# Patient Record
Sex: Male | Born: 1945 | Race: White | Hispanic: No | Marital: Married | Smoking: Current every day smoker
Health system: Southern US, Community
[De-identification: ages and names within clinical notes are randomized; demographics above are authoritative.]

## PROBLEM LIST (undated history)

## (undated) DIAGNOSIS — J439 Emphysema, unspecified: Secondary | ICD-10-CM

## (undated) DIAGNOSIS — I10 Essential (primary) hypertension: Secondary | ICD-10-CM

## (undated) DIAGNOSIS — I2510 Atherosclerotic heart disease of native coronary artery without angina pectoris: Secondary | ICD-10-CM

## (undated) DIAGNOSIS — I255 Ischemic cardiomyopathy: Secondary | ICD-10-CM

## (undated) DIAGNOSIS — J449 Chronic obstructive pulmonary disease, unspecified: Secondary | ICD-10-CM

## (undated) DIAGNOSIS — I739 Peripheral vascular disease, unspecified: Secondary | ICD-10-CM

## (undated) DIAGNOSIS — E785 Hyperlipidemia, unspecified: Secondary | ICD-10-CM

## (undated) HISTORY — DX: Atherosclerotic heart disease of native coronary artery without angina pectoris: I25.10

## (undated) HISTORY — DX: Essential (primary) hypertension: I10

## (undated) HISTORY — PX: SPINE SURGERY: SHX786

## (undated) HISTORY — DX: Ischemic cardiomyopathy: I25.5

## (undated) HISTORY — PX: OTHER SURGICAL HISTORY: SHX169

## (undated) HISTORY — DX: Chronic obstructive pulmonary disease, unspecified: J44.9

## (undated) HISTORY — PX: BACK SURGERY: SHX140

## (undated) HISTORY — DX: Emphysema, unspecified: J43.9

## (undated) HISTORY — DX: Peripheral vascular disease, unspecified: I73.9

## (undated) HISTORY — DX: Hyperlipidemia, unspecified: E78.5

---

## 2009-05-02 ENCOUNTER — Encounter: Payer: Self-pay | Admitting: Cardiovascular Disease

## 2009-05-14 ENCOUNTER — Encounter: Payer: Self-pay | Admitting: Cardiovascular Disease

## 2009-05-22 ENCOUNTER — Ambulatory Visit: Payer: Self-pay | Admitting: Cardiovascular Disease

## 2009-05-22 ENCOUNTER — Encounter: Payer: Self-pay | Admitting: Cardiovascular Disease

## 2009-05-27 ENCOUNTER — Encounter: Payer: Self-pay | Admitting: Cardiovascular Disease

## 2009-06-06 ENCOUNTER — Encounter: Payer: Self-pay | Admitting: Cardiovascular Disease

## 2009-06-07 ENCOUNTER — Telehealth: Payer: Self-pay | Admitting: Cardiovascular Disease

## 2009-07-30 ENCOUNTER — Ambulatory Visit: Payer: Self-pay | Admitting: Cardiovascular Disease

## 2009-07-31 ENCOUNTER — Telehealth (INDEPENDENT_AMBULATORY_CARE_PROVIDER_SITE_OTHER): Payer: Self-pay | Admitting: *Deleted

## 2009-08-01 ENCOUNTER — Encounter: Payer: Self-pay | Admitting: Cardiology

## 2009-08-01 ENCOUNTER — Encounter (HOSPITAL_COMMUNITY): Admission: RE | Admit: 2009-08-01 | Discharge: 2009-10-03 | Payer: Self-pay | Admitting: Cardiovascular Disease

## 2009-08-01 ENCOUNTER — Ambulatory Visit: Payer: Self-pay

## 2009-08-01 ENCOUNTER — Ambulatory Visit: Payer: Self-pay | Admitting: Cardiology

## 2009-08-02 ENCOUNTER — Telehealth: Payer: Self-pay | Admitting: Cardiovascular Disease

## 2009-08-13 ENCOUNTER — Encounter: Payer: Self-pay | Admitting: Cardiovascular Disease

## 2009-08-13 DIAGNOSIS — I739 Peripheral vascular disease, unspecified: Secondary | ICD-10-CM | POA: Insufficient documentation

## 2009-08-23 ENCOUNTER — Encounter: Payer: Self-pay | Admitting: Cardiovascular Disease

## 2009-08-23 ENCOUNTER — Ambulatory Visit: Payer: Self-pay

## 2009-08-27 ENCOUNTER — Ambulatory Visit: Payer: Self-pay | Admitting: Cardiovascular Disease

## 2009-08-27 ENCOUNTER — Encounter (INDEPENDENT_AMBULATORY_CARE_PROVIDER_SITE_OTHER): Payer: Self-pay | Admitting: *Deleted

## 2009-08-29 ENCOUNTER — Encounter: Payer: Self-pay | Admitting: Cardiovascular Disease

## 2009-09-04 ENCOUNTER — Ambulatory Visit: Payer: Self-pay | Admitting: Cardiovascular Disease

## 2009-09-04 ENCOUNTER — Ambulatory Visit (HOSPITAL_COMMUNITY): Admission: RE | Admit: 2009-09-04 | Discharge: 2009-09-04 | Payer: Self-pay | Admitting: Cardiovascular Disease

## 2010-02-11 NOTE — Miscellaneous (Signed)
  Clinical Lists Changes  Medications: Added new medication of PLAVIX 75 MG TABS (CLOPIDOGREL BISULFATE) Take one tablet by mouth daily - Signed Rx of PLAVIX 75 MG TABS (CLOPIDOGREL BISULFATE) Take one tablet by mouth daily;  #30 x 12;  Signed;  Entered by: Deliah Goody, RN;  Authorized by: Lorine Bears MD;  Method used: Electronically to [facility]. [phone]*, [address], [place], [place], [place]  [address], Ph: [phone] or [phone], Fax: [phone]    Prescriptions: PLAVIX 75 MG TABS (CLOPIDOGREL BISULFATE) Take one tablet by mouth daily  #30 x 12   Entered by:   Deliah Goody, RN   Authorized by:   Lorine Bears MD   Signed by:   Deliah Goody, RN on 08/27/2009   Method used:   Electronically to        [facility]. [phone]* (retail)       [address]       [place], [place]  [address]       Ph: [phone] or [phone]       Fax: [phone]   RxID:   [id number]

## 2010-02-11 NOTE — Assessment & Plan Note (Signed)
Summary: Cardiology Nuclear Study  Nuclear Med Background Indications for Stress Test: Evaluation for Ischemia, Graft Patency, Stent Patency   History: CABG, Echo, Heart Catheterization, Stents  History Comments: h/o MI x 3; multiple Stents; '99 CABG x 4;  3/11 Echo:EF=15-20%; h/o CHF/ICM  Symptoms: DOE, Fatigue, SOB    Nuclear Pre-Procedure Cardiac Risk Factors: Claudication, Hypertension, LBBB, Lipids, PVD, Smoker Caffeine/Decaff Intake: None NPO After: 8:00 PM Lungs: Diminished with exp. wheezes  Albuterol Inhaler 2 puffs given IV 0.9% NS with Angio Cath: 22g     IV Site: (R) AC IV Started by: Irean Hong RN Chest Size (in) 38     Height (in): 65.5 Weight (lb): 131 BMI: 21.55  Nuclear Med Study 1 or 2 day study:  1 day     Stress Test Type:  Eugenie Birks Reading MD:  Marca Ancona, MD     Referring MD:  Judie Petit.Arida Resting Radionuclide:  Technetium 74m Tetrofosmin     Resting Radionuclide Dose:  11.0 mCi  Stress Radionuclide:  Technetium 71m Tetrofosmin     Stress Radionuclide Dose:  33.0 mCi   Stress Protocol   Lexiscan: 0.4 mg   Stress Test Technologist:  Milana Na EMT-P     Nuclear Technologist:  Harlow Asa CNMT  Rest Procedure  Myocardial perfusion imaging was performed at rest 45 minutes following the intravenous administration of Myoview Technetium 57m Tetrofosmin.  Stress Procedure  The patient received IV Lexiscan 0.4 mg over 15-seconds.  Myoview injected at 30-seconds.  There were no significant changes with infusion.  Quantitative spect images were obtained after a 45 minute delay.  QPS Raw Data Images:  Normal; no motion artifact; normal heart/lung ratio. Stress Images:  Basal to mid anteroseptal, apical, and basal inferolateral perfusion defects.  Rest Images:  Basal to mid anteroseptal perfusion defect not as marked as with stress.  Apical and basal inferolateral perfusion defect similar to stress.  Subtraction (SDS):  Partially reversible basal  to mid anteroseptal perfusion defect.  Fixed apical perfusion defect and fixed basal inferolateral perfusion defect.  Transient Ischemic Dilatation:  1.05  (Normal <1.22)  Lung/Heart Ratio:  .35  (Normal <0.45)  Quantitative Gated Spect Images QGS EDV:  144 ml QGS ESV:  104 ml QGS EF:  28 % QGS cine images:  Severe global hypokinesis.  The LV appears mildly dilated.    Overall Impression  Exercise Capacity: Lexiscan study  BP Response: Normal blood pressure response. Clinical Symptoms: Short of breath ECG Impression: Baseline IVCD with some ST-T abnormalities, no change with infusion.  Overall Impression: Fixed apical and basal inferolateral perfusion defects likely suggestive of infarction.  Partially reversible basal to mid anteroseptal perfusion defect suggests component of ischemia. Overall Impression Comments: Severe systolic dysfunction.

## 2010-02-11 NOTE — Miscellaneous (Signed)
Summary: Orders Update  Clinical Lists Changes  Orders: Added new Referral order of PV Procedure (PV Procedure) - Signed 

## 2010-02-11 NOTE — Progress Notes (Signed)
  Phone Note Outgoing Call   Call placed by: Dessie Coma,  Jun 07, 2009 1:42 PM Summary of Call: Patient notified per Dr. Freida Busman, labs look good.

## 2010-02-11 NOTE — Progress Notes (Signed)
  Phone Note Outgoing Call   Call placed by: Dessie Coma  LPN,  August 02, 2009 2:56 PM Call placed to: Patient Summary of Call: Patient notified per Dr. Kirke Corin, Nuclear Stress test was OK.  To keep f/u appt on 08/16/09.

## 2010-02-11 NOTE — Letter (Signed)
Summary: Peripheral Vascular  [facility], Main Office  [address]   [place], [place] [address]   Phone: [phone]  Fax: [phone]     08/29/2009 MRN: [id number]  AUTHOR HATLESTAD [address] [place], [place]  [address]  Dear Evan Weeks,   You are scheduled for Peripheral Vascular Angiogram on Wednesday September 04, 2009 with Dr. Excell Seltzer.  Please arrive at the [facility] at 7:00       a.m. on the day of your procedure.  1. DIET     _X___ Nothing to eat or drink after midnight except your medications with a sip of water.  2. MAKE SURE YOU TAKE YOUR ASPIRIN AND PLAVIX.  3. __X___ DO NOT TAKE these medications before your procedure:        Do not take Furosemide the morning of procedure.        _X___ YOU MAY TAKE ALL of your remaining medications with a small amount of water.     4. Plan for one night stay - bring personal belongings (i.e. toothpaste, toothbrush, etc.)  5. Bring a current list of your medications and current insurance cards.  6. Must have a responsible person to drive you home.   7. Someone must be with you for the first 24 hours after you arrive home.  8. Please wear clothes that are easy to get on and off and wear slip-on shoes.  *Special note: Every effort is made to have your procedure done on time.  Occasionally there are emergencies that present themselves at the hospital that may cause delays.  Please be patient if a delay does occur.  If you have any questions after you get home, please call the office at the number listed above.   Julieta Gutting, RN, BSN

## 2010-02-11 NOTE — Miscellaneous (Signed)
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 

## 2010-02-11 NOTE — Progress Notes (Signed)
Summary: NUCLEAR PRE PROCEDURE  Phone Note Outgoing Call Call back at [facility] Phone [phone]   Call placed by: Rea College, CMA,  July 31, 2009 3:39 PM Call placed to: Patient Summary of Call: Reviewed information on Myoview Information Sheet (see scanned document for further details).  Spoke with patient.      Nuclear Med Background Indications for Stress Test: Evaluation for Ischemia, Graft Patency, Stent Patency   History: CABG, Echo, Heart Catheterization, Stents  History Comments: h/o MI x 3; multiple Stents; '99 CABG x 4;  3/11 Echo:EF=15-20%; h/o CHF/ICM  Symptoms: DOE, SOB    Nuclear Pre-Procedure Cardiac Risk Factors: Claudication, Hypertension, LBBB, Lipids, PVD, Smoker

## 2010-03-28 LAB — CBC
HCT: 41.7 % (ref 39.0–52.0)
MCHC: 34.5 g/dL (ref 30.0–36.0)
RDW: 12.9 % (ref 11.5–15.5)

## 2010-03-28 LAB — BASIC METABOLIC PANEL
BUN: 25 mg/dL — ABNORMAL HIGH (ref 6–23)
CO2: 29 mEq/L (ref 19–32)
Calcium: 9.7 mg/dL (ref 8.4–10.5)
GFR calc non Af Amer: >60 mL/min (ref >60)
Potassium: 4.8 mEq/L (ref 3.5–5.1)
Sodium: 135 mEq/L (ref 135–145)

## 2010-05-22 ENCOUNTER — Encounter: Payer: Self-pay | Admitting: Cardiovascular Disease

## 2010-05-27 NOTE — Letter (Signed)
May 22, 2009    Charlott Rakes, MD  [address]  [address]  [place], [place] [address]   RE:  AUGUSTO, DECKMAN  MRN:  [id number]  /  DOB:  1945-07-17   Dear Dr. Yetta Flock,   I had a pleasure of seeing Mr. Tenpas in clinic this morning.  As you  know he is a 65 year old white male with a history of ischemic  cardiomyopathy with an ejection fraction of 25-30%.  He has recently had  an increase in dyspnea that has been responsive to outpatient treatment  with Lasix and lisinopril.  He currently denies any symptoms of angina  and has symptoms consistent with NYHA class II heart failure.  He had  been on metoprolol, however, he is no longer taking this medication.  Today in clinic, I have started Coreg 3.125 mg b.i.d.  I have asked him  to continue on the aspirin, lisinopril, and Lasix.  I have set him up  with an appointment to see Dr. Graciela Husbands regarding evaluation for possible  biventricular pacing and ICD placement.   Thank you for the referral of this patient.  I look forward to following  him along with you.    Sincerely,      Brayton El, MD  Electronically Signed    SGA/MedQ  DD: 05/22/2009  DT: 05/23/2009  Job #: [id number]

## 2010-05-27 NOTE — Assessment & Plan Note (Signed)
[facility]                        [facility] OFFICE NOTE   NAME:Evan Weeks, Evan Weeks                        MRN:          [id number]  DATE:07/30/2009                            DOB:          [birth year]    PRIMARY CARE PHYSICIAN:  Dr. Yetta Flock.   CHIEF COMPLAINT:  Shortness of breath and lower extremity claudication.   PROBLEMS LIST:  1. Coronary artery disease status post myocardial infarction x3.  He      underwent multiple PCIs in the past before he ultimately underwent      coronary artery bypass surgery in 1999 at [facility].  No interventions since then.  2. Ischemic cardiomyopathy with an ejection fraction of 20-25%.  3. Peripheral arterial disease status post bilateral iliac      intervention in May and June 19, 2008.  4. Hypertension.  5. Hyperlipidemia.  6. Tobacco use.   INTERVAL HISTORY:  Evan Weeks is here today for a followup visit.  He  was seen by Dr. Freida Busman in May for evaluation of severely reduced LV  systolic function.  At that time, he was started on small-dose  carvedilol.  The patient has been taking all his medications.  His  dyspnea has improved significantly after he was started on Lasix.  He  does have chronic dyspnea; however, he is not able to do much physical  activities due to limitations related to bilateral lower extremity  claudication.  He starts getting discomfort in the lower thigh and  bilateral calf areas with minimal walking.  This actually has been  getting worse.  He did have improvement after he had angioplasty done  last year, but most recently has been having these symptoms even with  walking less than 100 feet.  Usually, he continues to walk with the  pain.  He denies any chest pain.   SOCIAL HISTORY:  He continued to smoke 1 pack per day since he was 65  years old.  He drinks beer occasionally.   ALLERGIES:  No known drug allergies.   CURRENT MEDICATIONS:  1. Aspirin 325 mg  once daily.  2. Lisinopril 10 mg daily, this will be increased today to 20 mg      daily.  3. Lasix 20 mg daily.  4. Coreg 3.125 mg twice daily, this will be increased to 6.25 mg twice      daily.  5. Fish oil once daily.  6. A cholesterol medication that was started recently, but he is not      sure which one.   REVIEW OF SYSTEMS:  As above in HPI.  Otherwise, all systems have been  reviewed and are negative.   PHYSICAL EXAMINATION:  GENERAL:  He is in no acute distress.  VITAL SIGNS:  Weight is 135.6 pounds, blood pressure is 165/89, pulse is  84, oxygen saturation is 98% on room air.  NECK:  Reveals no masses, no JVD, or carotid bruits.  RESPIRATORY:  Normal.  Respiratory effort with no use of accessory  muscles.  There is decreased breath sounds at the base  especially.  There is no wheezing or crackles.  CARDIOVASCULAR:  Regular rate and rhythm with normal S1, S2.  There are  no gallops or murmurs.  ABDOMEN:  Benign, nontender, nondistended with no masses.  EXTREMITIES:  With no clubbing, cyanosis, or edema.  VASCULAR:  His femoral pulses are +1 or +2 bilaterally with bruits.  Popliteal pulses are not palpated.  The right dorsalis pedis pulse is +1  on the right side and absent on the left side.   IMPRESSION:  1. Congestive heart failure due to ischemic cardiomyopathy and      severely reduced LV systolic function.  He is currently [place]      Heart Association class II.  However, it is likely difficult to      exactly estimate his functional capacity due to significant lower      extremity claudication.  Most recent ejection fraction was 15-20%      by echocardiogram with mild mitral regurgitation.  This is not new      for him.  He did have a stress test in the past more than a year      ago and his ejection fraction was 20% at that time.  I think it is      important to evaluate for any residual ischemia and see if coronary      angiography is indicated.  Thus, I  recommend proceeding with      Lexiscan nuclear stress test.  In the meanwhile, we have to treat      him aggressively with heart failure medications.  I will go ahead      and increase his lisinopril to 20 mg once daily and increase Coreg      to 6.25 mg twice daily.  We will continue with small-dose Lasix.  I      discussed the indications of an ICD with him.  He will be referred      again to see Dr. Graciela Husbands after the current workup is done.  His QRS      was borderline, prolonged, but not sure if he would qualify for      biventricular ICD.  2. Coronary artery disease status post coronary artery bypass surgery.      Continue with aspirin daily.  He was also started on a cholesterol      medication, but does not know the name.  I asked him to bring his      medications with him for the next visit.  3. Peripheral arterial disease with significant lifestyle limiting      bilateral claudications, mostly below the knees.  He did have      previous iliac interventions last year.  I will go ahead and obtain      an ABI for further evaluation.  Based on the results, can consider      proceeding with angiography versus trial of cilostazol.  The      patient will return after these tests are done for further      evaluation.     Lorine Bears, MD  Electronically Signed    MA/MedQ  DD: 07/30/2009  DT: 07/31/2009  Job #: [id number]

## 2010-05-27 NOTE — Assessment & Plan Note (Signed)
[facility]                        [place] CARDIOLOG[facility] OFFICE NOTE   NAME:Evan Weeks, Evan Weeks                        MRN:          [id number]  DATE:08/27/2009                            DOB:          22-Oct-1945    This is a followup note.   PROBLEMS LIST:  1. Coronary artery disease status post myocardial infarction x3.  He      underwent multiple percutaneous coronary interventions in the past      before he ultimately underwent coronary artery bypass surgery in      1999 at [facility].  No interventions since then.  2. Ischemic cardiomyopathy with an ejection fraction of 20-25%.  3. Peripheral arterial disease status post right external iliac      angioplasty and stent placement in May 2010, followed by left      common iliac angioplasty in June 2010, done at [facility].  4. Hypertension.  5. Hyperlipidemia.  6. Tobacco use.   INTERVAL HISTORY:  Mr. Sales is here today for a followup visit.  During his last visit, I increased both his lisinopril and Coreg.  He  has been on small-dose furosemide.  His heart failure symptoms has  improved significantly.  At the moment, he denies any chest pain,  dyspnea, orthopnea, or PND.  His main complaint is continued lower  extremity claudication on the left side.  He describes discomfort and  cramps in the left calf as well as the thigh after walking less than 100  feet.  This is interfering with his activities of daily living.  Usually  the discomfort lasts for 5-10 minutes and it forces him to stop before  he can resume again.  There is no pain at rest.  There is no lower  extremity ulcers.   MEDICATIONS:  1. Aspirin 325 mg daily.  2. Furosemide 20 mg once daily.  3. Garlic.  4. Fish oil.  5. Lisinopril 20 mg once daily.  6. Coreg 6.25 mg twice daily.  7. Vitamin B12 once daily.  8. Cholesterol medication that he does not know the name of.   ALLERGIES:  No known drug  allergies.   PHYSICAL EXAMINATION:  GENERAL:  The patient is in no acute distress.  VITAL SIGNS:  Weight is 134 pounds, blood pressure is 133/78, pulse is  61.  HEENT:  Normocephalic, atraumatic.  NECK:  Reveals no JVD or carotid bruits.  RESPIRATORY:  Normal respiratory effort with no use of accessory  muscles.  Auscultation reveals normal breath sounds with no crackles or  wheezing.  CARDIOVASCULAR:  Normal PMI.  Regular rate and rhythm with no gallops or  murmurs.  ABDOMEN:  Benign, nontender, nondistended.  EXTREMITIES:  With no clubbing, cyanosis, or edema.  SKIN:  Reveals no rash.  PSYCHIATRIC:  He is alert and oriented x3 with normal mood and affect.  VASCULAR:  Femoral pulses are +1 bilaterally with bruits on both sides.  Popliteal pulse on the right side is +1 and absent on the left side.  Dorsalis pedis is +1 on the right side and absent on the  left side.   IMPRESSION:  1. Peripheral arterial disease with lifestyle limiting claudication on      the left side.  This was evaluated by lower extremity ABI and      Doppler examination.  His ABI was moderately reduced on the left      side at 0.67 and normal on the right side at 1.0.  Duplex imaging      showed focal stenosis more than 50% in the distal common femoral      artery and the mid SFA on the left side.  This actually correlates      with his symptoms.  His symptoms are significant enough that is      interfering with his activities of daily living.  He is not able to      do much exercise due to that.  I think best option is angiography      followed by attempt of percutaneous intervention.  The risks and      benefits were discussed with them and he is agreeable to proceed.      We will plan on obtaining access through the right femoral artery      and going above the iliac arch.  In the meanwhile, we will start      the patient on Plavix 75 mg once daily.  2. Congestive heart failure with severely reduced LV  systolic function      due to ischemic cardiomyopathy.  He is currently in [place] Heart      Association class II.  His symptoms has improved significantly      after adjusting his medications.  We will continue treatment with      Coreg, lisinopril, and Lasix.  We will consider adding      spironolactone or eplerenone in the future.  We will discuss again      with him the indication for an ICD during subsequent visit.  At the      present time, he prefers to take care of his lower extremity      claudication before discussing preventive measures.  3. Coronary artery disease status post coronary artery bypass surgery.      We did obtain Lexiscan nuclear stress test which for the most part      showed fixed defects in the inferolateral wall as well as      anteroseptal wall.  There was some reversibility in the      anteroseptal wall, suggesting some degree of ischemia.  However, at      the present time, his heart failure symptoms has improved      significantly and he is not having any anginal symptoms.  We will      continue with medical therapy.  4. Tobacco use.  The patient was counseled about smoking cessation.      He will follow up with me after his peripheral angiogram and      possible intervention.     Lorine Bears, MD  Electronically Signed    MA/MedQ  DD: 08/27/2009  DT: 08/28/2009  Job #: [id number]

## 2010-05-27 NOTE — Assessment & Plan Note (Signed)
[facility]                        [place] CARDIOLOGY OFFICE NOTE   NAME:Evan Weeks, Evan Weeks                        MRN:          [id number]  DATE:05/22/2009                            DOB:          1946/01/11    CHIEF COMPLAINT:  Shortness of breath.   HISTORY OF PRESENT ILLNESS:  Mr. Strout is a 65 year old white male with  past medical history significant for coronary artery disease status post  4-vessel CABG in 1999, hypertension, claudication with peripheral  vascular disease status post bilateral lower extremity  revascularization, who is presenting for treatment of systolic  dysfunction.  The patient states that several weeks ago, he had  worsening shortness of breath.  He was seen by his primary care  physician diagnosed with systolic dysfunction, EF 15-20%.  At that time,  he was placed on Lasix and lisinopril.  He states he lost approximately  8 pounds and his shortness of breath has significantly improved.  He  denies any episodes of chest pain or chest discomfort.  He states that  since he has been on the Lasix, he can now lay down and breathe normally  whereas before he could not.  He denies any lower extremity edema.  He  had been on aspirin, Plavix, and metoprolol; however, he had been  intermittently compliant with dose.  He is currently compliant with  aspirin, lisinopril, and Lasix.   PAST MEDICAL HISTORY:  As above in HPI.   SOCIAL HISTORY:  He continues to smoke tobacco, he drinks alcohol  occasionally.   FAMILY HISTORY:  Negative for premature coronary disease.   ALLERGIES:  No known drug allergies.   MEDICATIONS:  1. Aspirin 325 daily.  2. Lisinopril 10 mg daily.  3. Lasix 20 mg daily.  Lisinopril and Lasix reserved on May 14, 2009.      He is no longer taking the metoprolol and Plavix that he was      previously taking.   REVIEW OF SYSTEMS:  As above in HPI.  In addition, the patient has some  occasional tingling in his  left hand, other systems reviewed and are  negative.   PHYSICAL EXAMINATION:  VITAL SIGNS:  On physical exam, his blood  pressure is 140/76, pulse is 85, saturating 98% on room air, and he  weighs 134 pounds.  GENERAL:  No acute distress.  HEENT:  Normocephalic, atraumatic.  NECK:  Supple.  There is no JVD.  There are no carotid bruits.  HEART:  Regular rate and rhythm without murmur, rub, or gallop.  LUNGS:  Clear bilaterally, but decreased breath sounds at the left base.  ABDOMEN:  Soft, nontender, nondistended.  EXTREMITIES:  No edema.  SKIN:  Warm and dry .  PSYCHIATRIC:  The patient is appropriate with normal levels of insight.  MUSCULOSKELETAL:  5/5 bilateral upper and lower extremity strength.   EKG taken today in clinic, independently interpreted by myself  demonstrates normal sinus rhythm with an incomplete left bundle-branch  block and nonspecific T-wave abnormalities.  The patient's  echocardiogram report dated March 16, 2009, EF estimated to be 15-20%,  moderate biatrial  enlargement, mild mitral regurgitation, moderate  tricuspid regurgitation, small posterior pericardial effusion.  Review  of a CT scan, dated May 02, 2009, there was no evidence of pulmonary  embolism, there was a moderate left-sided pleural effusion.  Review of  the patient's most recent lab dated May 16, 2009, sodium 124, potassium  5.1, chloride 89, CO2 of 27, BUN 20, creatinine 1.0, glucose 115.   ASSESSMENT:  A 65 year old white male with ischemic cardiomyopathy.  We  have no current records indicating how long the patient has had systolic  dysfunction.  He is currently not having any symptoms of angina.  He is  having symptoms of NYHA class II heart failure.  He is euvolemic on  exam.  The patient also has a history of peripheral arterial disease but  is currently asymptomatic from this.   PLAN:  We will need to obtain records from [facility] to  determine if this decreased systolic  function is new or old.  If it is  new, a left heart catheterization would be reasonable to determine any  new areas of coronary stenosis.  However, a viability study may be  prudent prior to any invasive evaluation.  Today, we will start the  patient on Coreg 3.125 mg b.i.d. in addition to the aspirin and  lisinopril he is currently taking.  We will check a CMP, fasting lipids,  CBC, and vitamin D level.  He will be referred to Dr. Graciela Husbands for  consideration of an ICD and possible Bi-V ICD.  After results of labs,  he will also likely be placed on statin therapy.  Lastly, he is not  having any symptoms of peripheral arterial disease but it will be  reasonable at the next office visit to get the carotid Doppler studies.     Brayton El, MD  Electronically Signed    SGA/MedQ  DD: 05/22/2009  DT: 05/22/2009  Job #: [id number]

## 2010-05-27 NOTE — Procedures (Signed)
NAME:  Evan Weeks, Evan Weeks               ACCOUNT NO.:  [id number]   MEDICAL RECORD NO.:  [id number]          PATIENT TYPE:  OIB   LOCATION:  2899                         FACILITY:  [facility]   PHYSICIAN:  Lorine Bears, MD     DATE OF BIRTH:  1945-09-15   DATE OF PROCEDURE:  DATE OF DISCHARGE:                    PERIPHERAL VASCULAR INVASIVE PROCEDURE   PROCEDURES PERFORMED:  1. Abdominal aortography.  2. Bilateral iliac angiography.  3. Lower extremity arterial runoff angiography.  4. Selective left lower extremity arterial angiography.  5. Angioplasty of the mid superficial femoral artery.  6. Stent placement of mid superficial femoral artery.   PREOPERATIVE DIAGNOSIS:  Peripheral arterial disease with intermittent  claudication.   POSTOPERATIVE DIAGNOSIS:  Peripheral arterial disease with intermittent  claudication.   CLINICAL HISTORY:  Evan Weeks is a 65 year old gentleman with history of  coronary artery disease status post coronary artery bypass surgery,  ischemic cardiomyopathy with severely reduced LV systolic function as  well as known history of peripheral arterial disease.  He underwent  percutaneous coronary intervention a year ago to his bilateral iliac  arteries at [facility].  The patient had improvement  in his symptoms at bedtime.  However, most recently he started having  significant left calf and thigh claudication with minimal walking.  The  patient underwent an invasive evaluation with an ABI which was  moderately reduced on the left side at 0.67 and normal on the right  side.  There was evidence of severe mid-SFA stenosis.  Due to severity  of symptoms and the lifestyle-limiting claudication, it was decided to  proceed with percutaneous intervention.  Alternatives were discussed  with the patient.   PROCEDURE DETAILS:  Risks and benefits were discussed with the patient.  Standard informed consent was obtained.  Conscious sedation was achieved  with Versed and fentanyl.  Both groins were prepped in a sterile  fashion.  The right groin area was anesthetized with 2% lidocaine.  The  right common femoral artery was accessed with standard technique.  A  Wholey wire was advanced and then a 5-French sheath was placed.  A  pigtail catheter was advanced over the wire and placed in the abdominal  aorta slightly above the renal arteries.  Abdominal angiography was  performed in the AP position.  The catheter was withdrawn from the  distal aorta above the iliac bifurcation and iliac angiography was done  in the AP position and in the RAO position.  Lower extremity angiography  was then performed.  After reviewing the images, it was determined that  there was a severe diffuse stenosis in the mid SFA correlating with his  symptoms and his ABI.  It was decided to proceed with percutaneous  intervention.  The left iliac artery was accessed through a Crossover  catheter.  The [facility] wire was advanced and placed in distal left common  femoral artery.  The 5-French sheath was then exchanged for a 7-French,  45 cm Destination sheath and placed in the left common femoral.  Further  lower extremity angiography was then performed through a selective  sheath now in the left common  femoral artery.  Systemic anticoagulation  was achieved with 5000 units of unfractionated heparin with subsequent  therapeutic ACT.  The lesion was then crossed with an angled Glidewire  0.35 over a Quick-Cross catheter.  The lesion was crossed with a Quick-  Cross catheter and the wire was withdrawn and replaced with a Wholey  wire.  The lesion was then dilated with a 5.0 x 100 cm balloon with 2  inflations.  The angiography revealed a flow-limiting dissection, and we  decided to proceed with stent placement.  A 6.0 x 120-mm Protege self-  expanding stent was placed which was then post-dilated with a 6.0  balloon.  Repeat lower extremity arterial angiography showed  excellent  results with very good stent flow.  Then, then wire was withdrawn, and  sheath was returned to the distal aorta.  It was sutured in place to be removed once the ACT is <180.   HEMODYNAMIC FINDINGS:  1. ABDOMINAL AORTOGRAPHY:  The celiac, SMA, and IMA appear to be      patent.  There are 2 solitary renal arteries on both sides which      seems to be free of any significant disease.  Abdominal aorta below      the renal artery has diffuse atherosclerosis but no discrete      aneurysm or stenosis.  2. RIGHT LOWER EXTREMITY:  The common iliac has an ostial 40%      stenosis.  There are 2 overlapped stents in the distal common iliac      into the common femoral artery without siginificant stenosis.  The profunda      artery is free of significant disease.  The SFA has minor luminal      irregularities with no significant disease.  The popliteal artery      overall seems to be very normal.  Right inferior tibial is free of      significant disease all the way down to the foot.  The      tibioperoneal trunk is also free of significant disease.  There was      three-vessel runoff on the right side.  3. LEFT LOWER EXTREMITY ARTERIAL ANGIOGRAPHY:  There was 50% ostial      stenosis in the common iliac artery.  The external iliac artery has      a patent stent.  Internal iliac seems to be diffusely diseased on      both sides.  Common femoral artery has a calcified 20% stenosis.      The profunda is normal.  The SFA is diffusely diseased      approximately 50% to 60%.  In the midsegment, there is diffuse 99%      stenosis with a length of about 10 cm.  The popliteal is free of      significant disease.  Anterior tibial and tibioperoneal trunk have      only minor irregularities.  There is three-vessel runoff on the      left side all the way to the foot, as well.   IMPRESSION:  1. Patent iliac stent in the right and left side.  There is moderate      ostial iliac stenosis  bilaterally.  2. There is severe 99% diffuse stenosis in the mid-superficial femoral      artery as well as moderate 50% to 60% diffuse disease proximally.  3. Successful angioplasty and self-expanding stent placement to the      mid-superficial femoral artery stenosis.  Proximal superficial      femoral artery disease was left to be treated medically given that      it was long and diffusely diseased in nature.   RECOMMENDATIONS:  1. Continue dual antiplatelet therapy with aspirin and Plavix.  2. We will hydrate the patient and discharge home in about 4 hours if      there are no complications.  3. We will continue aggressive treatment of risk factors and recommend      a walking exercise program.   Please note that proctoring physician for this procedure is Dr. Tonny Bollman.      Lorine Bears, MD     MA/MEDQ  D:  09/04/2009  T:  09/04/2009  Job:  [id number]   cc:   Veverly Fells. Excell Seltzer, MD   Electronically Signed by Lorine Bears MD on 10/03/2009 06:14:00 PM

## 2010-07-08 ENCOUNTER — Encounter: Payer: Self-pay | Admitting: Cardiovascular Disease

## 2010-08-11 ENCOUNTER — Encounter: Payer: Self-pay | Admitting: Cardiovascular Disease

## 2010-09-11 ENCOUNTER — Other Ambulatory Visit: Payer: Self-pay | Admitting: *Deleted

## 2010-09-11 MED ORDER — CLOPIDOGREL BISULFATE 75 MG PO TABS: 75.0000 mg | ORAL_TABLET | Freq: Every day | ORAL | Status: DC

## 2010-09-12 ENCOUNTER — Encounter: Payer: Medicare Other | Admitting: Thoracic Surgery (Cardiothoracic Vascular Surgery)

## 2010-12-18 DIAGNOSIS — T8132XA Disruption of internal operation (surgical) wound, not elsewhere classified, initial encounter: Secondary | ICD-10-CM

## 2010-12-18 DIAGNOSIS — T81329A Deep disruption or dehiscence of operation wound, unspecified, initial encounter: Secondary | ICD-10-CM

## 2010-12-31 ENCOUNTER — Ambulatory Visit: Payer: Medicare Other | Admitting: Thoracic Surgery (Cardiothoracic Vascular Surgery)

## 2010-12-31 DIAGNOSIS — Z5189 Encounter for other specified aftercare: Secondary | ICD-10-CM

## 2010-12-31 NOTE — Progress Notes (Signed)
The patient is doing well. Wound is well healed except for a small area distally that is draining a small amount of purulent fluid. The wound was probed by Dr Donata Clay. There is a small track superiorly approximatly one centimeter. The wound was packe diwht gauze and the wife was instructed how to perform this. She will change it once daily. The patient has had no fevers. Keflex 500mg  po tid was prescribed for one wekk. RTC 1 week.

## 2011-01-07 ENCOUNTER — Ambulatory Visit (INDEPENDENT_AMBULATORY_CARE_PROVIDER_SITE_OTHER): Payer: Medicare Other | Admitting: Thoracic Surgery (Cardiothoracic Vascular Surgery)

## 2011-01-07 DIAGNOSIS — T8132XA Disruption of internal operation (surgical) wound, not elsewhere classified, initial encounter: Secondary | ICD-10-CM

## 2011-01-07 DIAGNOSIS — Z5189 Encounter for other specified aftercare: Secondary | ICD-10-CM

## 2011-01-07 NOTE — Progress Notes (Signed)
The wound is healing well. The wound is approx 1cm wide and 2 cm deep. The patient and his wife are packing it daily. There is a small amount  of greenish drainage. No odor, redness or fever. Pt has 2 mor anitbiotic pills to take and he will then discontinue. Plan- Continue current care. RTC 2 weeks.

## 2011-01-08 ENCOUNTER — Other Ambulatory Visit: Payer: Self-pay | Admitting: Cardiovascular Disease

## 2011-01-21 ENCOUNTER — Ambulatory Visit (INDEPENDENT_AMBULATORY_CARE_PROVIDER_SITE_OTHER): Payer: Medicare Other | Admitting: Physician Assistant

## 2011-01-21 DIAGNOSIS — IMO0001 Reserved for inherently not codable concepts without codable children: Secondary | ICD-10-CM

## 2011-01-21 DIAGNOSIS — Z4800 Encounter for change or removal of nonsurgical wound dressing: Secondary | ICD-10-CM

## 2011-01-21 NOTE — Progress Notes (Signed)
Mr. Solum was referred to [place] with exposed sternal wire with associated sternal tenderness. He was taken to the operating room on 12/18/2010 by Dr. been tried in sternal wires were removed. The incision was closed except for a small area at the lower end of the incision which has been treated with saline wet-to-dry dressings. This has continued to heal without any sign of complication.  Mr. Amsden return to the office today for scheduled followup and dressing change. The wound packing was removed. There is a small open wound at the bottom end of the sternal incision which measures less than 1 cm in diameter and less than 1 cm deep. The tissues within the wound are clean and granulating. There is no drainage present. The sternum is stable along its entire length. Using sterile technique a new saline moistened gauze dressing was packed into the wound and a sterile covering applied.  I asked Mr. Henrichs in his family to continue with daily dressing changes as they have been doing. He is reassured that this wound should heal in its entirety in the next 2-3 weeks. I see no reason for any followup in the office but offered to see Mr. Kincer or discuss this further with a phone should any problems arise or if there is any delay in closure as we have discussed.

## 2011-07-02 IMAGING — CR DG CHEST 2V
2 series · 2 of 2 positions shown · non-contrast
Comparison: None.

CLINICAL DATA: Peripheral vascular disease, pre-procedure
evaluation

CHEST - 2 VIEW

[view not recorded (1 of 2)]
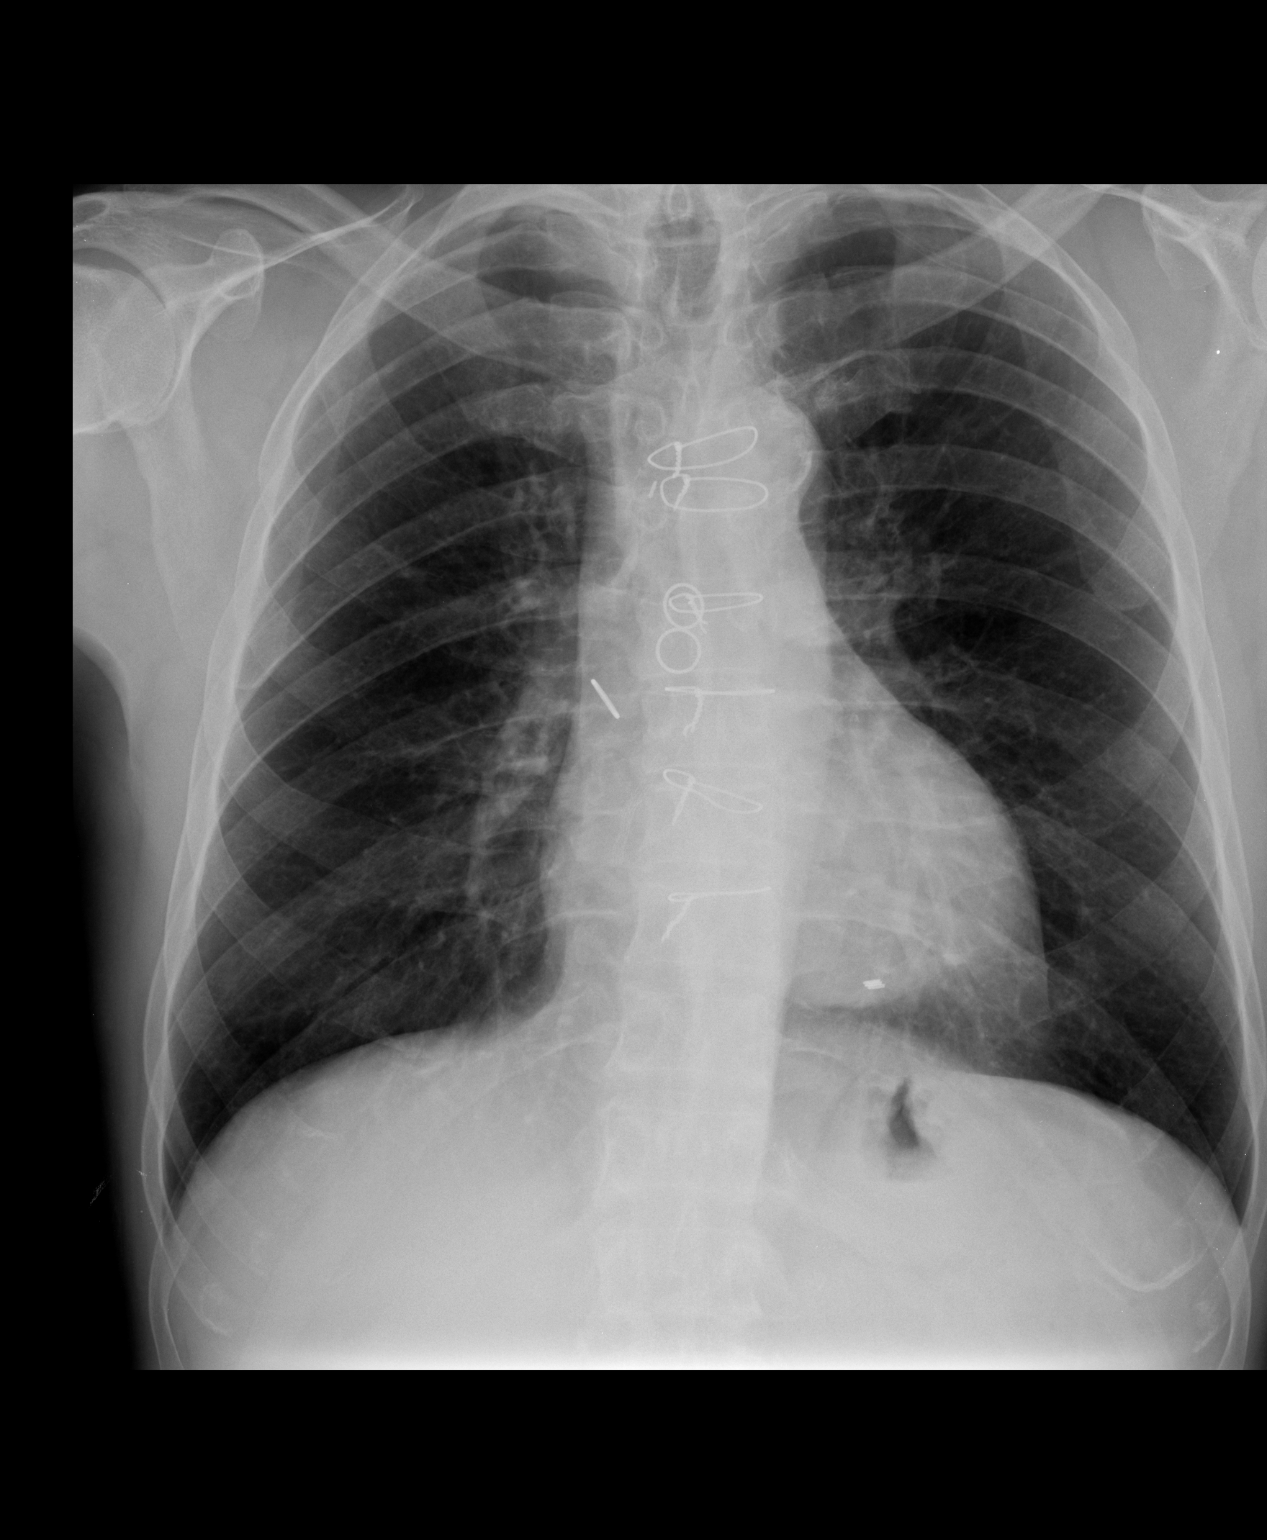

[view not recorded (2 of 2)]
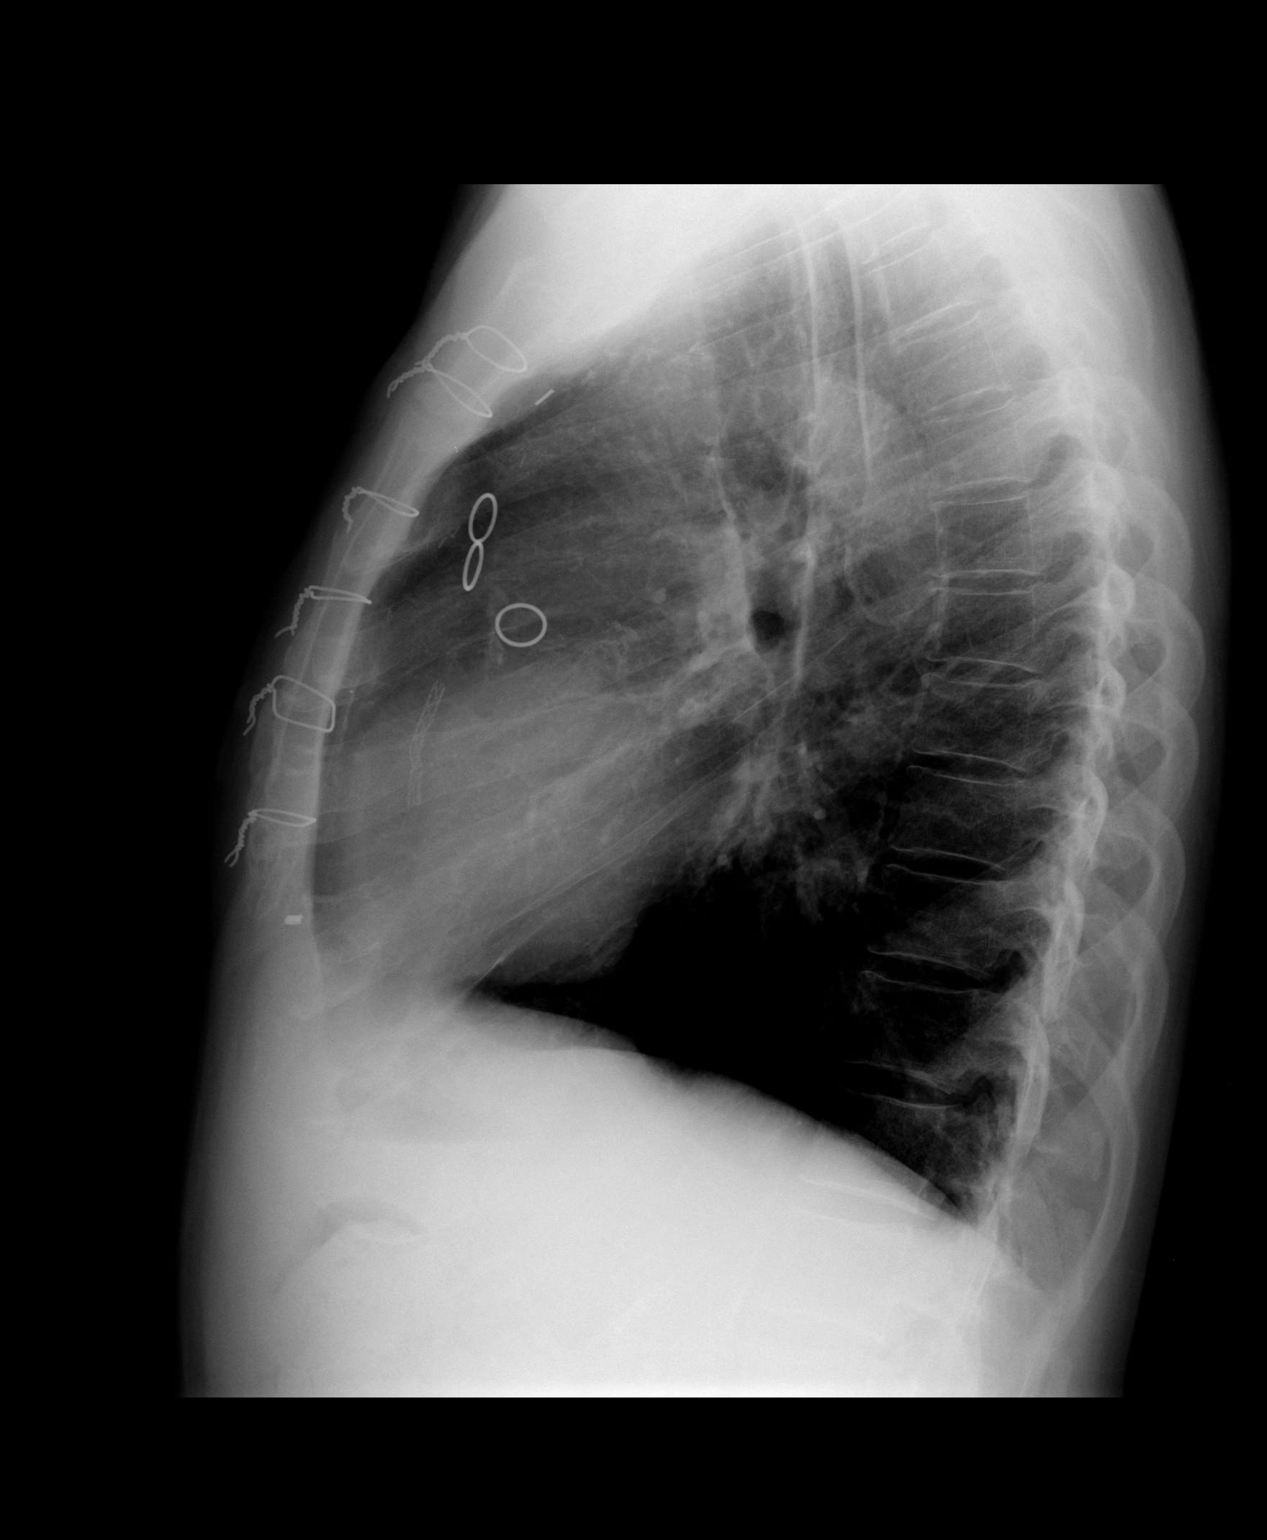

[2 of 2 positions shown; findings below may reference images not displayed]

FINDINGS: Previous coronary bypass changes noted.  Normal heart
size and vascularity.  Negative for pneumonia, edema, effusion or
pneumothorax.  Midline trachea.  Atherosclerosis of the aorta.
IMPRESSION: Postop changes.  No acute chest process.

## 2014-04-18 DIAGNOSIS — I509 Heart failure, unspecified: Secondary | ICD-10-CM | POA: Diagnosis not present

## 2014-04-18 DIAGNOSIS — Z6825 Body mass index (BMI) 25.0-25.9, adult: Secondary | ICD-10-CM | POA: Diagnosis not present

## 2014-04-21 DIAGNOSIS — N2889 Other specified disorders of kidney and ureter: Secondary | ICD-10-CM | POA: Diagnosis not present

## 2014-04-21 DIAGNOSIS — R531 Weakness: Secondary | ICD-10-CM | POA: Diagnosis not present

## 2014-04-21 DIAGNOSIS — N12 Tubulo-interstitial nephritis, not specified as acute or chronic: Secondary | ICD-10-CM | POA: Diagnosis not present

## 2014-04-21 DIAGNOSIS — N1330 Unspecified hydronephrosis: Secondary | ICD-10-CM | POA: Diagnosis not present

## 2014-04-21 DIAGNOSIS — N329 Bladder disorder, unspecified: Secondary | ICD-10-CM | POA: Diagnosis not present

## 2014-04-22 DIAGNOSIS — R309 Painful micturition, unspecified: Secondary | ICD-10-CM | POA: Diagnosis not present

## 2014-04-22 DIAGNOSIS — J90 Pleural effusion, not elsewhere classified: Secondary | ICD-10-CM | POA: Diagnosis not present

## 2014-04-22 DIAGNOSIS — R197 Diarrhea, unspecified: Secondary | ICD-10-CM | POA: Diagnosis not present

## 2014-04-22 DIAGNOSIS — N411 Chronic prostatitis: Secondary | ICD-10-CM | POA: Diagnosis not present

## 2014-04-22 DIAGNOSIS — Z950 Presence of cardiac pacemaker: Secondary | ICD-10-CM | POA: Diagnosis not present

## 2014-04-22 DIAGNOSIS — E871 Hypo-osmolality and hyponatremia: Secondary | ICD-10-CM | POA: Diagnosis not present

## 2014-04-22 DIAGNOSIS — Q6231 Congenital ureterocele, orthotopic: Secondary | ICD-10-CM | POA: Diagnosis not present

## 2014-04-22 DIAGNOSIS — J189 Pneumonia, unspecified organism: Secondary | ICD-10-CM | POA: Diagnosis not present

## 2014-04-22 DIAGNOSIS — R6520 Severe sepsis without septic shock: Secondary | ICD-10-CM | POA: Diagnosis not present

## 2014-04-22 DIAGNOSIS — Z716 Tobacco abuse counseling: Secondary | ICD-10-CM | POA: Diagnosis not present

## 2014-04-22 DIAGNOSIS — N10 Acute tubulo-interstitial nephritis: Secondary | ICD-10-CM | POA: Diagnosis not present

## 2014-04-22 DIAGNOSIS — N329 Bladder disorder, unspecified: Secondary | ICD-10-CM | POA: Diagnosis not present

## 2014-04-22 DIAGNOSIS — E860 Dehydration: Secondary | ICD-10-CM | POA: Diagnosis not present

## 2014-04-22 DIAGNOSIS — N1330 Unspecified hydronephrosis: Secondary | ICD-10-CM | POA: Diagnosis not present

## 2014-04-22 DIAGNOSIS — N12 Tubulo-interstitial nephritis, not specified as acute or chronic: Secondary | ICD-10-CM | POA: Diagnosis not present

## 2014-04-22 DIAGNOSIS — J9589 Other postprocedural complications and disorders of respiratory system, not elsewhere classified: Secondary | ICD-10-CM | POA: Diagnosis not present

## 2014-04-22 DIAGNOSIS — F17210 Nicotine dependence, cigarettes, uncomplicated: Secondary | ICD-10-CM | POA: Diagnosis not present

## 2014-04-22 DIAGNOSIS — N2889 Other specified disorders of kidney and ureter: Secondary | ICD-10-CM | POA: Diagnosis not present

## 2014-04-22 DIAGNOSIS — N401 Enlarged prostate with lower urinary tract symptoms: Secondary | ICD-10-CM | POA: Diagnosis not present

## 2014-04-22 DIAGNOSIS — I25810 Atherosclerosis of coronary artery bypass graft(s) without angina pectoris: Secondary | ICD-10-CM | POA: Diagnosis not present

## 2014-04-22 DIAGNOSIS — Z79899 Other long term (current) drug therapy: Secondary | ICD-10-CM | POA: Diagnosis not present

## 2014-04-22 DIAGNOSIS — I2510 Atherosclerotic heart disease of native coronary artery without angina pectoris: Secondary | ICD-10-CM | POA: Diagnosis not present

## 2014-04-22 DIAGNOSIS — Z7982 Long term (current) use of aspirin: Secondary | ICD-10-CM | POA: Diagnosis not present

## 2014-04-22 DIAGNOSIS — I517 Cardiomegaly: Secondary | ICD-10-CM | POA: Diagnosis not present

## 2014-04-22 DIAGNOSIS — B9620 Unspecified Escherichia coli [E. coli] as the cause of diseases classified elsewhere: Secondary | ICD-10-CM | POA: Diagnosis not present

## 2014-04-22 DIAGNOSIS — S37009A Unspecified injury of unspecified kidney, initial encounter: Secondary | ICD-10-CM | POA: Diagnosis not present

## 2014-04-22 DIAGNOSIS — Z951 Presence of aortocoronary bypass graft: Secondary | ICD-10-CM | POA: Diagnosis not present

## 2014-04-22 DIAGNOSIS — R338 Other retention of urine: Secondary | ICD-10-CM | POA: Diagnosis not present

## 2014-04-22 DIAGNOSIS — I509 Heart failure, unspecified: Secondary | ICD-10-CM | POA: Diagnosis not present

## 2014-04-22 DIAGNOSIS — N189 Chronic kidney disease, unspecified: Secondary | ICD-10-CM | POA: Diagnosis not present

## 2014-04-22 DIAGNOSIS — A419 Sepsis, unspecified organism: Secondary | ICD-10-CM | POA: Diagnosis not present

## 2014-04-22 DIAGNOSIS — Z720 Tobacco use: Secondary | ICD-10-CM | POA: Diagnosis not present

## 2014-04-22 DIAGNOSIS — R531 Weakness: Secondary | ICD-10-CM | POA: Diagnosis not present

## 2014-04-22 DIAGNOSIS — N1339 Other hydronephrosis: Secondary | ICD-10-CM | POA: Diagnosis not present

## 2014-04-22 DIAGNOSIS — J449 Chronic obstructive pulmonary disease, unspecified: Secondary | ICD-10-CM | POA: Diagnosis not present

## 2014-04-22 DIAGNOSIS — I129 Hypertensive chronic kidney disease with stage 1 through stage 4 chronic kidney disease, or unspecified chronic kidney disease: Secondary | ICD-10-CM | POA: Diagnosis not present

## 2014-05-02 DIAGNOSIS — N189 Chronic kidney disease, unspecified: Secondary | ICD-10-CM | POA: Diagnosis not present

## 2014-05-02 DIAGNOSIS — I509 Heart failure, unspecified: Secondary | ICD-10-CM | POA: Diagnosis not present

## 2014-05-02 DIAGNOSIS — J441 Chronic obstructive pulmonary disease with (acute) exacerbation: Secondary | ICD-10-CM | POA: Diagnosis not present

## 2014-05-02 DIAGNOSIS — Z950 Presence of cardiac pacemaker: Secondary | ICD-10-CM | POA: Diagnosis not present

## 2014-05-03 DIAGNOSIS — J441 Chronic obstructive pulmonary disease with (acute) exacerbation: Secondary | ICD-10-CM | POA: Diagnosis not present

## 2014-05-03 DIAGNOSIS — N189 Chronic kidney disease, unspecified: Secondary | ICD-10-CM | POA: Diagnosis not present

## 2014-05-03 DIAGNOSIS — I509 Heart failure, unspecified: Secondary | ICD-10-CM | POA: Diagnosis not present

## 2014-05-03 DIAGNOSIS — Z950 Presence of cardiac pacemaker: Secondary | ICD-10-CM | POA: Diagnosis not present

## 2014-05-04 DIAGNOSIS — N2889 Other specified disorders of kidney and ureter: Secondary | ICD-10-CM | POA: Diagnosis not present

## 2014-05-04 DIAGNOSIS — N1330 Unspecified hydronephrosis: Secondary | ICD-10-CM | POA: Diagnosis not present

## 2014-05-04 DIAGNOSIS — I5023 Acute on chronic systolic (congestive) heart failure: Secondary | ICD-10-CM | POA: Diagnosis not present

## 2014-05-04 DIAGNOSIS — N12 Tubulo-interstitial nephritis, not specified as acute or chronic: Secondary | ICD-10-CM | POA: Diagnosis not present

## 2014-05-05 DIAGNOSIS — I509 Heart failure, unspecified: Secondary | ICD-10-CM | POA: Diagnosis not present

## 2014-05-05 DIAGNOSIS — Z950 Presence of cardiac pacemaker: Secondary | ICD-10-CM | POA: Diagnosis not present

## 2014-05-05 DIAGNOSIS — J441 Chronic obstructive pulmonary disease with (acute) exacerbation: Secondary | ICD-10-CM | POA: Diagnosis not present

## 2014-05-05 DIAGNOSIS — N189 Chronic kidney disease, unspecified: Secondary | ICD-10-CM | POA: Diagnosis not present

## 2014-05-07 DIAGNOSIS — I509 Heart failure, unspecified: Secondary | ICD-10-CM | POA: Diagnosis not present

## 2014-05-07 DIAGNOSIS — Z950 Presence of cardiac pacemaker: Secondary | ICD-10-CM | POA: Diagnosis not present

## 2014-05-07 DIAGNOSIS — J441 Chronic obstructive pulmonary disease with (acute) exacerbation: Secondary | ICD-10-CM | POA: Diagnosis not present

## 2014-05-07 DIAGNOSIS — N189 Chronic kidney disease, unspecified: Secondary | ICD-10-CM | POA: Diagnosis not present

## 2014-05-08 DIAGNOSIS — N3090 Cystitis, unspecified without hematuria: Secondary | ICD-10-CM | POA: Diagnosis not present

## 2014-05-08 DIAGNOSIS — N318 Other neuromuscular dysfunction of bladder: Secondary | ICD-10-CM | POA: Diagnosis not present

## 2014-05-08 DIAGNOSIS — R339 Retention of urine, unspecified: Secondary | ICD-10-CM | POA: Diagnosis not present

## 2014-05-08 DIAGNOSIS — N401 Enlarged prostate with lower urinary tract symptoms: Secondary | ICD-10-CM | POA: Diagnosis not present

## 2014-05-10 DIAGNOSIS — N189 Chronic kidney disease, unspecified: Secondary | ICD-10-CM | POA: Diagnosis not present

## 2014-05-10 DIAGNOSIS — I509 Heart failure, unspecified: Secondary | ICD-10-CM | POA: Diagnosis not present

## 2014-05-10 DIAGNOSIS — Z950 Presence of cardiac pacemaker: Secondary | ICD-10-CM | POA: Diagnosis not present

## 2014-05-10 DIAGNOSIS — J441 Chronic obstructive pulmonary disease with (acute) exacerbation: Secondary | ICD-10-CM | POA: Diagnosis not present

## 2014-05-13 DIAGNOSIS — I509 Heart failure, unspecified: Secondary | ICD-10-CM | POA: Diagnosis not present

## 2014-05-13 DIAGNOSIS — N189 Chronic kidney disease, unspecified: Secondary | ICD-10-CM | POA: Diagnosis not present

## 2014-05-13 DIAGNOSIS — Z950 Presence of cardiac pacemaker: Secondary | ICD-10-CM | POA: Diagnosis not present

## 2014-05-13 DIAGNOSIS — J441 Chronic obstructive pulmonary disease with (acute) exacerbation: Secondary | ICD-10-CM | POA: Diagnosis not present

## 2014-05-14 DIAGNOSIS — N189 Chronic kidney disease, unspecified: Secondary | ICD-10-CM | POA: Diagnosis not present

## 2014-05-14 DIAGNOSIS — I509 Heart failure, unspecified: Secondary | ICD-10-CM | POA: Diagnosis not present

## 2014-05-14 DIAGNOSIS — Z950 Presence of cardiac pacemaker: Secondary | ICD-10-CM | POA: Diagnosis not present

## 2014-05-14 DIAGNOSIS — J441 Chronic obstructive pulmonary disease with (acute) exacerbation: Secondary | ICD-10-CM | POA: Diagnosis not present

## 2014-05-17 DIAGNOSIS — Z950 Presence of cardiac pacemaker: Secondary | ICD-10-CM | POA: Diagnosis not present

## 2014-05-17 DIAGNOSIS — I509 Heart failure, unspecified: Secondary | ICD-10-CM | POA: Diagnosis not present

## 2014-05-17 DIAGNOSIS — J441 Chronic obstructive pulmonary disease with (acute) exacerbation: Secondary | ICD-10-CM | POA: Diagnosis not present

## 2014-05-17 DIAGNOSIS — N189 Chronic kidney disease, unspecified: Secondary | ICD-10-CM | POA: Diagnosis not present

## 2014-05-22 DIAGNOSIS — Z0000 Encounter for general adult medical examination without abnormal findings: Secondary | ICD-10-CM | POA: Diagnosis not present

## 2014-05-22 DIAGNOSIS — Z1389 Encounter for screening for other disorder: Secondary | ICD-10-CM | POA: Diagnosis not present

## 2014-05-22 DIAGNOSIS — Z139 Encounter for screening, unspecified: Secondary | ICD-10-CM | POA: Diagnosis not present

## 2014-05-23 DIAGNOSIS — Z950 Presence of cardiac pacemaker: Secondary | ICD-10-CM | POA: Diagnosis not present

## 2014-05-23 DIAGNOSIS — I509 Heart failure, unspecified: Secondary | ICD-10-CM | POA: Diagnosis not present

## 2014-05-23 DIAGNOSIS — N189 Chronic kidney disease, unspecified: Secondary | ICD-10-CM | POA: Diagnosis not present

## 2014-05-23 DIAGNOSIS — J441 Chronic obstructive pulmonary disease with (acute) exacerbation: Secondary | ICD-10-CM | POA: Diagnosis not present

## 2014-05-25 DIAGNOSIS — Z950 Presence of cardiac pacemaker: Secondary | ICD-10-CM | POA: Diagnosis not present

## 2014-05-25 DIAGNOSIS — N189 Chronic kidney disease, unspecified: Secondary | ICD-10-CM | POA: Diagnosis not present

## 2014-05-25 DIAGNOSIS — I509 Heart failure, unspecified: Secondary | ICD-10-CM | POA: Diagnosis not present

## 2014-05-25 DIAGNOSIS — J441 Chronic obstructive pulmonary disease with (acute) exacerbation: Secondary | ICD-10-CM | POA: Diagnosis not present

## 2014-05-26 DIAGNOSIS — Z950 Presence of cardiac pacemaker: Secondary | ICD-10-CM | POA: Diagnosis not present

## 2014-05-26 DIAGNOSIS — J441 Chronic obstructive pulmonary disease with (acute) exacerbation: Secondary | ICD-10-CM | POA: Diagnosis not present

## 2014-05-26 DIAGNOSIS — I509 Heart failure, unspecified: Secondary | ICD-10-CM | POA: Diagnosis not present

## 2014-05-26 DIAGNOSIS — N189 Chronic kidney disease, unspecified: Secondary | ICD-10-CM | POA: Diagnosis not present

## 2014-05-27 DIAGNOSIS — Z950 Presence of cardiac pacemaker: Secondary | ICD-10-CM | POA: Diagnosis not present

## 2014-05-27 DIAGNOSIS — N189 Chronic kidney disease, unspecified: Secondary | ICD-10-CM | POA: Diagnosis not present

## 2014-05-27 DIAGNOSIS — J441 Chronic obstructive pulmonary disease with (acute) exacerbation: Secondary | ICD-10-CM | POA: Diagnosis not present

## 2014-05-27 DIAGNOSIS — I509 Heart failure, unspecified: Secondary | ICD-10-CM | POA: Diagnosis not present

## 2014-05-28 DIAGNOSIS — N189 Chronic kidney disease, unspecified: Secondary | ICD-10-CM | POA: Diagnosis not present

## 2014-05-28 DIAGNOSIS — I509 Heart failure, unspecified: Secondary | ICD-10-CM | POA: Diagnosis not present

## 2014-05-28 DIAGNOSIS — Z950 Presence of cardiac pacemaker: Secondary | ICD-10-CM | POA: Diagnosis not present

## 2014-05-28 DIAGNOSIS — J441 Chronic obstructive pulmonary disease with (acute) exacerbation: Secondary | ICD-10-CM | POA: Diagnosis not present

## 2014-05-29 DIAGNOSIS — M19041 Primary osteoarthritis, right hand: Secondary | ICD-10-CM | POA: Diagnosis not present

## 2014-05-29 DIAGNOSIS — Z7982 Long term (current) use of aspirin: Secondary | ICD-10-CM | POA: Diagnosis not present

## 2014-05-29 DIAGNOSIS — M19042 Primary osteoarthritis, left hand: Secondary | ICD-10-CM | POA: Diagnosis not present

## 2014-05-29 DIAGNOSIS — M7121 Synovial cyst of popliteal space [Baker], right knee: Secondary | ICD-10-CM | POA: Diagnosis not present

## 2014-05-29 DIAGNOSIS — I10 Essential (primary) hypertension: Secondary | ICD-10-CM | POA: Diagnosis not present

## 2014-05-29 DIAGNOSIS — Z720 Tobacco use: Secondary | ICD-10-CM | POA: Diagnosis not present

## 2014-05-29 DIAGNOSIS — I25810 Atherosclerosis of coronary artery bypass graft(s) without angina pectoris: Secondary | ICD-10-CM | POA: Diagnosis not present

## 2014-05-29 DIAGNOSIS — Z95820 Peripheral vascular angioplasty status with implants and grafts: Secondary | ICD-10-CM | POA: Diagnosis not present

## 2014-05-29 DIAGNOSIS — I739 Peripheral vascular disease, unspecified: Secondary | ICD-10-CM | POA: Diagnosis not present

## 2014-05-29 DIAGNOSIS — I2510 Atherosclerotic heart disease of native coronary artery without angina pectoris: Secondary | ICD-10-CM | POA: Diagnosis not present

## 2014-05-29 DIAGNOSIS — J441 Chronic obstructive pulmonary disease with (acute) exacerbation: Secondary | ICD-10-CM | POA: Diagnosis not present

## 2014-05-29 DIAGNOSIS — R0602 Shortness of breath: Secondary | ICD-10-CM | POA: Diagnosis not present

## 2014-05-29 DIAGNOSIS — J90 Pleural effusion, not elsewhere classified: Secondary | ICD-10-CM | POA: Diagnosis not present

## 2014-05-29 DIAGNOSIS — I272 Other secondary pulmonary hypertension: Secondary | ICD-10-CM | POA: Diagnosis not present

## 2014-05-29 DIAGNOSIS — I252 Old myocardial infarction: Secondary | ICD-10-CM | POA: Diagnosis not present

## 2014-05-29 DIAGNOSIS — Z716 Tobacco abuse counseling: Secondary | ICD-10-CM | POA: Diagnosis not present

## 2014-05-29 DIAGNOSIS — I509 Heart failure, unspecified: Secondary | ICD-10-CM | POA: Diagnosis not present

## 2014-05-29 DIAGNOSIS — I447 Left bundle-branch block, unspecified: Secondary | ICD-10-CM | POA: Diagnosis not present

## 2014-05-29 DIAGNOSIS — F419 Anxiety disorder, unspecified: Secondary | ICD-10-CM | POA: Diagnosis not present

## 2014-05-29 DIAGNOSIS — E876 Hypokalemia: Secondary | ICD-10-CM | POA: Diagnosis not present

## 2014-05-29 DIAGNOSIS — Z95810 Presence of automatic (implantable) cardiac defibrillator: Secondary | ICD-10-CM | POA: Diagnosis not present

## 2014-05-29 DIAGNOSIS — N189 Chronic kidney disease, unspecified: Secondary | ICD-10-CM | POA: Diagnosis not present

## 2014-05-29 DIAGNOSIS — I340 Nonrheumatic mitral (valve) insufficiency: Secondary | ICD-10-CM | POA: Diagnosis not present

## 2014-05-29 DIAGNOSIS — J181 Lobar pneumonia, unspecified organism: Secondary | ICD-10-CM | POA: Diagnosis not present

## 2014-05-29 DIAGNOSIS — I429 Cardiomyopathy, unspecified: Secondary | ICD-10-CM | POA: Diagnosis not present

## 2014-05-29 DIAGNOSIS — I5023 Acute on chronic systolic (congestive) heart failure: Secondary | ICD-10-CM | POA: Diagnosis not present

## 2014-05-29 DIAGNOSIS — R0902 Hypoxemia: Secondary | ICD-10-CM | POA: Diagnosis not present

## 2014-05-29 DIAGNOSIS — I129 Hypertensive chronic kidney disease with stage 1 through stage 4 chronic kidney disease, or unspecified chronic kidney disease: Secondary | ICD-10-CM | POA: Diagnosis not present

## 2014-05-29 DIAGNOSIS — M7989 Other specified soft tissue disorders: Secondary | ICD-10-CM | POA: Diagnosis not present

## 2014-05-29 DIAGNOSIS — J918 Pleural effusion in other conditions classified elsewhere: Secondary | ICD-10-CM | POA: Diagnosis not present

## 2014-05-29 DIAGNOSIS — J9601 Acute respiratory failure with hypoxia: Secondary | ICD-10-CM | POA: Diagnosis not present

## 2014-05-29 DIAGNOSIS — J189 Pneumonia, unspecified organism: Secondary | ICD-10-CM | POA: Diagnosis not present

## 2014-06-02 DIAGNOSIS — N189 Chronic kidney disease, unspecified: Secondary | ICD-10-CM | POA: Diagnosis not present

## 2014-06-02 DIAGNOSIS — J441 Chronic obstructive pulmonary disease with (acute) exacerbation: Secondary | ICD-10-CM | POA: Diagnosis not present

## 2014-06-02 DIAGNOSIS — I509 Heart failure, unspecified: Secondary | ICD-10-CM | POA: Diagnosis not present

## 2014-06-02 DIAGNOSIS — Z950 Presence of cardiac pacemaker: Secondary | ICD-10-CM | POA: Diagnosis not present

## 2014-06-05 DIAGNOSIS — I509 Heart failure, unspecified: Secondary | ICD-10-CM | POA: Diagnosis not present

## 2014-06-05 DIAGNOSIS — J441 Chronic obstructive pulmonary disease with (acute) exacerbation: Secondary | ICD-10-CM | POA: Diagnosis not present

## 2014-06-05 DIAGNOSIS — Z950 Presence of cardiac pacemaker: Secondary | ICD-10-CM | POA: Diagnosis not present

## 2014-06-05 DIAGNOSIS — N189 Chronic kidney disease, unspecified: Secondary | ICD-10-CM | POA: Diagnosis not present

## 2014-06-07 DIAGNOSIS — N318 Other neuromuscular dysfunction of bladder: Secondary | ICD-10-CM | POA: Diagnosis not present

## 2014-06-07 DIAGNOSIS — I10 Essential (primary) hypertension: Secondary | ICD-10-CM | POA: Diagnosis not present

## 2014-06-07 DIAGNOSIS — I509 Heart failure, unspecified: Secondary | ICD-10-CM | POA: Diagnosis not present

## 2014-06-07 DIAGNOSIS — I2510 Atherosclerotic heart disease of native coronary artery without angina pectoris: Secondary | ICD-10-CM | POA: Diagnosis not present

## 2014-06-07 DIAGNOSIS — N3090 Cystitis, unspecified without hematuria: Secondary | ICD-10-CM | POA: Diagnosis not present

## 2014-06-07 DIAGNOSIS — J441 Chronic obstructive pulmonary disease with (acute) exacerbation: Secondary | ICD-10-CM | POA: Diagnosis not present

## 2014-06-07 DIAGNOSIS — R339 Retention of urine, unspecified: Secondary | ICD-10-CM | POA: Diagnosis not present

## 2014-06-07 DIAGNOSIS — N401 Enlarged prostate with lower urinary tract symptoms: Secondary | ICD-10-CM | POA: Diagnosis not present

## 2014-06-08 DIAGNOSIS — Z95810 Presence of automatic (implantable) cardiac defibrillator: Secondary | ICD-10-CM | POA: Diagnosis not present

## 2014-06-08 DIAGNOSIS — Z720 Tobacco use: Secondary | ICD-10-CM | POA: Diagnosis not present

## 2014-06-08 DIAGNOSIS — J441 Chronic obstructive pulmonary disease with (acute) exacerbation: Secondary | ICD-10-CM | POA: Diagnosis not present

## 2014-06-08 DIAGNOSIS — N400 Enlarged prostate without lower urinary tract symptoms: Secondary | ICD-10-CM | POA: Diagnosis not present

## 2014-06-08 DIAGNOSIS — I2510 Atherosclerotic heart disease of native coronary artery without angina pectoris: Secondary | ICD-10-CM | POA: Diagnosis not present

## 2014-06-08 DIAGNOSIS — I272 Other secondary pulmonary hypertension: Secondary | ICD-10-CM | POA: Diagnosis not present

## 2014-06-08 DIAGNOSIS — K219 Gastro-esophageal reflux disease without esophagitis: Secondary | ICD-10-CM | POA: Diagnosis not present

## 2014-06-08 DIAGNOSIS — I429 Cardiomyopathy, unspecified: Secondary | ICD-10-CM | POA: Diagnosis not present

## 2014-06-08 DIAGNOSIS — M19042 Primary osteoarthritis, left hand: Secondary | ICD-10-CM | POA: Diagnosis not present

## 2014-06-08 DIAGNOSIS — R531 Weakness: Secondary | ICD-10-CM | POA: Diagnosis not present

## 2014-06-08 DIAGNOSIS — E785 Hyperlipidemia, unspecified: Secondary | ICD-10-CM | POA: Diagnosis not present

## 2014-06-08 DIAGNOSIS — N179 Acute kidney failure, unspecified: Secondary | ICD-10-CM | POA: Diagnosis not present

## 2014-06-08 DIAGNOSIS — Z951 Presence of aortocoronary bypass graft: Secondary | ICD-10-CM | POA: Diagnosis not present

## 2014-06-08 DIAGNOSIS — I252 Old myocardial infarction: Secondary | ICD-10-CM | POA: Diagnosis not present

## 2014-06-08 DIAGNOSIS — M19041 Primary osteoarthritis, right hand: Secondary | ICD-10-CM | POA: Diagnosis not present

## 2014-06-08 DIAGNOSIS — N189 Chronic kidney disease, unspecified: Secondary | ICD-10-CM | POA: Diagnosis not present

## 2014-06-08 DIAGNOSIS — E871 Hypo-osmolality and hyponatremia: Secondary | ICD-10-CM | POA: Diagnosis not present

## 2014-06-08 DIAGNOSIS — I5022 Chronic systolic (congestive) heart failure: Secondary | ICD-10-CM | POA: Diagnosis not present

## 2014-06-12 DIAGNOSIS — N189 Chronic kidney disease, unspecified: Secondary | ICD-10-CM | POA: Diagnosis not present

## 2014-06-12 DIAGNOSIS — I509 Heart failure, unspecified: Secondary | ICD-10-CM | POA: Diagnosis not present

## 2014-06-12 DIAGNOSIS — J441 Chronic obstructive pulmonary disease with (acute) exacerbation: Secondary | ICD-10-CM | POA: Diagnosis not present

## 2014-06-12 DIAGNOSIS — Z950 Presence of cardiac pacemaker: Secondary | ICD-10-CM | POA: Diagnosis not present

## 2014-06-13 DIAGNOSIS — Z950 Presence of cardiac pacemaker: Secondary | ICD-10-CM | POA: Diagnosis not present

## 2014-06-13 DIAGNOSIS — J441 Chronic obstructive pulmonary disease with (acute) exacerbation: Secondary | ICD-10-CM | POA: Diagnosis not present

## 2014-06-13 DIAGNOSIS — E871 Hypo-osmolality and hyponatremia: Secondary | ICD-10-CM | POA: Diagnosis not present

## 2014-06-13 DIAGNOSIS — I10 Essential (primary) hypertension: Secondary | ICD-10-CM | POA: Insufficient documentation

## 2014-06-13 DIAGNOSIS — F17210 Nicotine dependence, cigarettes, uncomplicated: Secondary | ICD-10-CM | POA: Insufficient documentation

## 2014-06-13 DIAGNOSIS — I2510 Atherosclerotic heart disease of native coronary artery without angina pectoris: Secondary | ICD-10-CM | POA: Insufficient documentation

## 2014-06-13 DIAGNOSIS — I429 Cardiomyopathy, unspecified: Secondary | ICD-10-CM | POA: Insufficient documentation

## 2014-06-13 DIAGNOSIS — N189 Chronic kidney disease, unspecified: Secondary | ICD-10-CM | POA: Diagnosis not present

## 2014-06-13 DIAGNOSIS — Z95810 Presence of automatic (implantable) cardiac defibrillator: Secondary | ICD-10-CM | POA: Diagnosis not present

## 2014-06-13 DIAGNOSIS — I509 Heart failure, unspecified: Secondary | ICD-10-CM | POA: Diagnosis not present

## 2014-06-13 DIAGNOSIS — E781 Pure hyperglyceridemia: Secondary | ICD-10-CM | POA: Diagnosis not present

## 2014-06-15 DIAGNOSIS — J441 Chronic obstructive pulmonary disease with (acute) exacerbation: Secondary | ICD-10-CM | POA: Diagnosis not present

## 2014-06-15 DIAGNOSIS — Z950 Presence of cardiac pacemaker: Secondary | ICD-10-CM | POA: Diagnosis not present

## 2014-06-15 DIAGNOSIS — N189 Chronic kidney disease, unspecified: Secondary | ICD-10-CM | POA: Diagnosis not present

## 2014-06-15 DIAGNOSIS — I509 Heart failure, unspecified: Secondary | ICD-10-CM | POA: Diagnosis not present

## 2014-06-19 DIAGNOSIS — E871 Hypo-osmolality and hyponatremia: Secondary | ICD-10-CM | POA: Diagnosis not present

## 2014-06-19 DIAGNOSIS — N179 Acute kidney failure, unspecified: Secondary | ICD-10-CM | POA: Diagnosis not present

## 2014-06-19 DIAGNOSIS — I5020 Unspecified systolic (congestive) heart failure: Secondary | ICD-10-CM | POA: Diagnosis not present

## 2014-06-19 DIAGNOSIS — E785 Hyperlipidemia, unspecified: Secondary | ICD-10-CM | POA: Diagnosis not present

## 2014-06-20 DIAGNOSIS — Z95810 Presence of automatic (implantable) cardiac defibrillator: Secondary | ICD-10-CM | POA: Diagnosis not present

## 2014-06-20 DIAGNOSIS — I10 Essential (primary) hypertension: Secondary | ICD-10-CM | POA: Diagnosis not present

## 2014-06-20 DIAGNOSIS — I429 Cardiomyopathy, unspecified: Secondary | ICD-10-CM | POA: Diagnosis not present

## 2014-06-20 DIAGNOSIS — E875 Hyperkalemia: Secondary | ICD-10-CM | POA: Insufficient documentation

## 2014-06-20 DIAGNOSIS — I2510 Atherosclerotic heart disease of native coronary artery without angina pectoris: Secondary | ICD-10-CM | POA: Diagnosis not present

## 2014-06-21 DIAGNOSIS — Z950 Presence of cardiac pacemaker: Secondary | ICD-10-CM | POA: Diagnosis not present

## 2014-06-21 DIAGNOSIS — N189 Chronic kidney disease, unspecified: Secondary | ICD-10-CM | POA: Diagnosis not present

## 2014-06-21 DIAGNOSIS — I509 Heart failure, unspecified: Secondary | ICD-10-CM | POA: Diagnosis not present

## 2014-06-21 DIAGNOSIS — J441 Chronic obstructive pulmonary disease with (acute) exacerbation: Secondary | ICD-10-CM | POA: Diagnosis not present

## 2014-07-04 DIAGNOSIS — R600 Localized edema: Secondary | ICD-10-CM | POA: Diagnosis not present

## 2014-07-04 DIAGNOSIS — N183 Chronic kidney disease, stage 3 (moderate): Secondary | ICD-10-CM | POA: Diagnosis not present

## 2014-07-04 DIAGNOSIS — E871 Hypo-osmolality and hyponatremia: Secondary | ICD-10-CM | POA: Diagnosis not present

## 2014-07-04 DIAGNOSIS — Z6824 Body mass index (BMI) 24.0-24.9, adult: Secondary | ICD-10-CM | POA: Diagnosis not present

## 2014-07-08 DIAGNOSIS — E785 Hyperlipidemia, unspecified: Secondary | ICD-10-CM | POA: Diagnosis not present

## 2014-07-08 DIAGNOSIS — R0602 Shortness of breath: Secondary | ICD-10-CM | POA: Diagnosis not present

## 2014-07-08 DIAGNOSIS — B952 Enterococcus as the cause of diseases classified elsewhere: Secondary | ICD-10-CM | POA: Diagnosis not present

## 2014-07-08 DIAGNOSIS — J9601 Acute respiratory failure with hypoxia: Secondary | ICD-10-CM | POA: Diagnosis not present

## 2014-07-08 DIAGNOSIS — F17210 Nicotine dependence, cigarettes, uncomplicated: Secondary | ICD-10-CM | POA: Diagnosis not present

## 2014-07-08 DIAGNOSIS — I25810 Atherosclerosis of coronary artery bypass graft(s) without angina pectoris: Secondary | ICD-10-CM | POA: Diagnosis not present

## 2014-07-08 DIAGNOSIS — J441 Chronic obstructive pulmonary disease with (acute) exacerbation: Secondary | ICD-10-CM | POA: Diagnosis not present

## 2014-07-08 DIAGNOSIS — I5023 Acute on chronic systolic (congestive) heart failure: Secondary | ICD-10-CM | POA: Diagnosis not present

## 2014-07-08 DIAGNOSIS — Z951 Presence of aortocoronary bypass graft: Secondary | ICD-10-CM | POA: Diagnosis not present

## 2014-07-08 DIAGNOSIS — M1990 Unspecified osteoarthritis, unspecified site: Secondary | ICD-10-CM | POA: Diagnosis not present

## 2014-07-08 DIAGNOSIS — R748 Abnormal levels of other serum enzymes: Secondary | ICD-10-CM | POA: Diagnosis not present

## 2014-07-08 DIAGNOSIS — N179 Acute kidney failure, unspecified: Secondary | ICD-10-CM | POA: Diagnosis not present

## 2014-07-08 DIAGNOSIS — R05 Cough: Secondary | ICD-10-CM | POA: Diagnosis not present

## 2014-07-08 DIAGNOSIS — I252 Old myocardial infarction: Secondary | ICD-10-CM | POA: Diagnosis not present

## 2014-07-08 DIAGNOSIS — E876 Hypokalemia: Secondary | ICD-10-CM | POA: Diagnosis not present

## 2014-07-08 DIAGNOSIS — Z79899 Other long term (current) drug therapy: Secondary | ICD-10-CM | POA: Diagnosis not present

## 2014-07-08 DIAGNOSIS — I519 Heart disease, unspecified: Secondary | ICD-10-CM | POA: Diagnosis not present

## 2014-07-08 DIAGNOSIS — N390 Urinary tract infection, site not specified: Secondary | ICD-10-CM | POA: Diagnosis not present

## 2014-07-08 DIAGNOSIS — I2510 Atherosclerotic heart disease of native coronary artery without angina pectoris: Secondary | ICD-10-CM | POA: Diagnosis not present

## 2014-07-08 DIAGNOSIS — I739 Peripheral vascular disease, unspecified: Secondary | ICD-10-CM | POA: Diagnosis not present

## 2014-07-08 DIAGNOSIS — N400 Enlarged prostate without lower urinary tract symptoms: Secondary | ICD-10-CM | POA: Diagnosis not present

## 2014-07-08 DIAGNOSIS — I509 Heart failure, unspecified: Secondary | ICD-10-CM | POA: Diagnosis not present

## 2014-07-08 DIAGNOSIS — Z95810 Presence of automatic (implantable) cardiac defibrillator: Secondary | ICD-10-CM | POA: Diagnosis not present

## 2014-07-08 DIAGNOSIS — I517 Cardiomegaly: Secondary | ICD-10-CM | POA: Diagnosis not present

## 2014-07-08 DIAGNOSIS — N189 Chronic kidney disease, unspecified: Secondary | ICD-10-CM | POA: Diagnosis not present

## 2014-07-08 DIAGNOSIS — K219 Gastro-esophageal reflux disease without esophagitis: Secondary | ICD-10-CM | POA: Diagnosis not present

## 2014-07-08 DIAGNOSIS — I129 Hypertensive chronic kidney disease with stage 1 through stage 4 chronic kidney disease, or unspecified chronic kidney disease: Secondary | ICD-10-CM | POA: Diagnosis not present

## 2014-07-08 DIAGNOSIS — Z720 Tobacco use: Secondary | ICD-10-CM | POA: Diagnosis not present

## 2014-07-08 DIAGNOSIS — I272 Other secondary pulmonary hypertension: Secondary | ICD-10-CM | POA: Diagnosis not present

## 2014-07-08 DIAGNOSIS — F419 Anxiety disorder, unspecified: Secondary | ICD-10-CM | POA: Diagnosis not present

## 2014-07-13 DIAGNOSIS — F419 Anxiety disorder, unspecified: Secondary | ICD-10-CM | POA: Diagnosis not present

## 2014-07-13 DIAGNOSIS — I252 Old myocardial infarction: Secondary | ICD-10-CM | POA: Diagnosis not present

## 2014-07-13 DIAGNOSIS — N179 Acute kidney failure, unspecified: Secondary | ICD-10-CM | POA: Diagnosis not present

## 2014-07-13 DIAGNOSIS — I2510 Atherosclerotic heart disease of native coronary artery without angina pectoris: Secondary | ICD-10-CM | POA: Diagnosis not present

## 2014-07-13 DIAGNOSIS — I509 Heart failure, unspecified: Secondary | ICD-10-CM | POA: Diagnosis not present

## 2014-07-13 DIAGNOSIS — J441 Chronic obstructive pulmonary disease with (acute) exacerbation: Secondary | ICD-10-CM | POA: Diagnosis not present

## 2014-07-18 DIAGNOSIS — J449 Chronic obstructive pulmonary disease, unspecified: Secondary | ICD-10-CM | POA: Diagnosis not present

## 2014-07-18 DIAGNOSIS — I10 Essential (primary) hypertension: Secondary | ICD-10-CM | POA: Diagnosis not present

## 2014-07-18 DIAGNOSIS — N183 Chronic kidney disease, stage 3 (moderate): Secondary | ICD-10-CM | POA: Diagnosis not present

## 2014-07-18 DIAGNOSIS — I509 Heart failure, unspecified: Secondary | ICD-10-CM | POA: Diagnosis not present

## 2014-07-19 DIAGNOSIS — E780 Pure hypercholesterolemia: Secondary | ICD-10-CM | POA: Diagnosis not present

## 2014-07-19 DIAGNOSIS — K219 Gastro-esophageal reflux disease without esophagitis: Secondary | ICD-10-CM | POA: Diagnosis not present

## 2014-07-19 DIAGNOSIS — J449 Chronic obstructive pulmonary disease, unspecified: Secondary | ICD-10-CM | POA: Diagnosis not present

## 2014-07-19 DIAGNOSIS — Z950 Presence of cardiac pacemaker: Secondary | ICD-10-CM | POA: Diagnosis not present

## 2014-07-19 DIAGNOSIS — I2510 Atherosclerotic heart disease of native coronary artery without angina pectoris: Secondary | ICD-10-CM | POA: Diagnosis not present

## 2014-07-19 DIAGNOSIS — J984 Other disorders of lung: Secondary | ICD-10-CM | POA: Diagnosis not present

## 2014-07-19 DIAGNOSIS — I509 Heart failure, unspecified: Secondary | ICD-10-CM | POA: Diagnosis not present

## 2014-07-19 DIAGNOSIS — Z79899 Other long term (current) drug therapy: Secondary | ICD-10-CM | POA: Diagnosis not present

## 2014-07-19 DIAGNOSIS — N289 Disorder of kidney and ureter, unspecified: Secondary | ICD-10-CM | POA: Diagnosis not present

## 2014-07-19 DIAGNOSIS — N189 Chronic kidney disease, unspecified: Secondary | ICD-10-CM | POA: Diagnosis not present

## 2014-07-19 DIAGNOSIS — Z720 Tobacco use: Secondary | ICD-10-CM | POA: Diagnosis not present

## 2014-07-19 DIAGNOSIS — N179 Acute kidney failure, unspecified: Secondary | ICD-10-CM | POA: Diagnosis not present

## 2014-07-27 DIAGNOSIS — N179 Acute kidney failure, unspecified: Secondary | ICD-10-CM | POA: Diagnosis not present

## 2014-07-27 DIAGNOSIS — I509 Heart failure, unspecified: Secondary | ICD-10-CM | POA: Diagnosis not present

## 2014-07-27 DIAGNOSIS — I2510 Atherosclerotic heart disease of native coronary artery without angina pectoris: Secondary | ICD-10-CM | POA: Diagnosis not present

## 2014-07-27 DIAGNOSIS — I252 Old myocardial infarction: Secondary | ICD-10-CM | POA: Diagnosis not present

## 2014-07-27 DIAGNOSIS — J441 Chronic obstructive pulmonary disease with (acute) exacerbation: Secondary | ICD-10-CM | POA: Diagnosis not present

## 2014-07-27 DIAGNOSIS — F419 Anxiety disorder, unspecified: Secondary | ICD-10-CM | POA: Diagnosis not present

## 2014-08-03 DIAGNOSIS — I2510 Atherosclerotic heart disease of native coronary artery without angina pectoris: Secondary | ICD-10-CM | POA: Diagnosis not present

## 2014-08-03 DIAGNOSIS — F419 Anxiety disorder, unspecified: Secondary | ICD-10-CM | POA: Diagnosis not present

## 2014-08-03 DIAGNOSIS — I509 Heart failure, unspecified: Secondary | ICD-10-CM | POA: Diagnosis not present

## 2014-08-03 DIAGNOSIS — J441 Chronic obstructive pulmonary disease with (acute) exacerbation: Secondary | ICD-10-CM | POA: Diagnosis not present

## 2014-08-03 DIAGNOSIS — I429 Cardiomyopathy, unspecified: Secondary | ICD-10-CM | POA: Diagnosis not present

## 2014-08-03 DIAGNOSIS — I10 Essential (primary) hypertension: Secondary | ICD-10-CM | POA: Diagnosis not present

## 2014-08-03 DIAGNOSIS — N179 Acute kidney failure, unspecified: Secondary | ICD-10-CM | POA: Diagnosis not present

## 2014-08-03 DIAGNOSIS — I252 Old myocardial infarction: Secondary | ICD-10-CM | POA: Diagnosis not present

## 2014-08-03 DIAGNOSIS — Z95810 Presence of automatic (implantable) cardiac defibrillator: Secondary | ICD-10-CM | POA: Diagnosis not present

## 2014-08-07 DIAGNOSIS — N179 Acute kidney failure, unspecified: Secondary | ICD-10-CM | POA: Diagnosis not present

## 2014-08-07 DIAGNOSIS — I509 Heart failure, unspecified: Secondary | ICD-10-CM | POA: Diagnosis not present

## 2014-08-07 DIAGNOSIS — I2510 Atherosclerotic heart disease of native coronary artery without angina pectoris: Secondary | ICD-10-CM | POA: Diagnosis not present

## 2014-08-07 DIAGNOSIS — J441 Chronic obstructive pulmonary disease with (acute) exacerbation: Secondary | ICD-10-CM | POA: Diagnosis not present

## 2014-08-07 DIAGNOSIS — F419 Anxiety disorder, unspecified: Secondary | ICD-10-CM | POA: Diagnosis not present

## 2014-08-07 DIAGNOSIS — I252 Old myocardial infarction: Secondary | ICD-10-CM | POA: Diagnosis not present

## 2014-08-13 DIAGNOSIS — J441 Chronic obstructive pulmonary disease with (acute) exacerbation: Secondary | ICD-10-CM | POA: Diagnosis not present

## 2014-08-13 DIAGNOSIS — F419 Anxiety disorder, unspecified: Secondary | ICD-10-CM | POA: Diagnosis not present

## 2014-08-13 DIAGNOSIS — N179 Acute kidney failure, unspecified: Secondary | ICD-10-CM | POA: Diagnosis not present

## 2014-08-13 DIAGNOSIS — I2510 Atherosclerotic heart disease of native coronary artery without angina pectoris: Secondary | ICD-10-CM | POA: Diagnosis not present

## 2014-08-13 DIAGNOSIS — I252 Old myocardial infarction: Secondary | ICD-10-CM | POA: Diagnosis not present

## 2014-08-13 DIAGNOSIS — I509 Heart failure, unspecified: Secondary | ICD-10-CM | POA: Diagnosis not present

## 2014-08-14 DIAGNOSIS — I252 Old myocardial infarction: Secondary | ICD-10-CM | POA: Diagnosis not present

## 2014-08-14 DIAGNOSIS — J441 Chronic obstructive pulmonary disease with (acute) exacerbation: Secondary | ICD-10-CM | POA: Diagnosis not present

## 2014-08-14 DIAGNOSIS — F419 Anxiety disorder, unspecified: Secondary | ICD-10-CM | POA: Diagnosis not present

## 2014-08-14 DIAGNOSIS — I2510 Atherosclerotic heart disease of native coronary artery without angina pectoris: Secondary | ICD-10-CM | POA: Diagnosis not present

## 2014-08-14 DIAGNOSIS — I509 Heart failure, unspecified: Secondary | ICD-10-CM | POA: Diagnosis not present

## 2014-08-14 DIAGNOSIS — N179 Acute kidney failure, unspecified: Secondary | ICD-10-CM | POA: Diagnosis not present

## 2014-09-19 DIAGNOSIS — N184 Chronic kidney disease, stage 4 (severe): Secondary | ICD-10-CM | POA: Diagnosis not present

## 2014-09-19 DIAGNOSIS — I779 Disorder of arteries and arterioles, unspecified: Secondary | ICD-10-CM | POA: Diagnosis not present

## 2014-09-19 DIAGNOSIS — I2510 Atherosclerotic heart disease of native coronary artery without angina pectoris: Secondary | ICD-10-CM | POA: Diagnosis not present

## 2014-09-19 DIAGNOSIS — M79605 Pain in left leg: Secondary | ICD-10-CM | POA: Diagnosis not present

## 2014-09-20 DIAGNOSIS — N189 Chronic kidney disease, unspecified: Secondary | ICD-10-CM | POA: Diagnosis not present

## 2014-09-25 DIAGNOSIS — I739 Peripheral vascular disease, unspecified: Secondary | ICD-10-CM | POA: Insufficient documentation

## 2014-09-25 DIAGNOSIS — Z95810 Presence of automatic (implantable) cardiac defibrillator: Secondary | ICD-10-CM | POA: Diagnosis not present

## 2014-09-25 DIAGNOSIS — I429 Cardiomyopathy, unspecified: Secondary | ICD-10-CM | POA: Diagnosis not present

## 2014-09-25 DIAGNOSIS — F17210 Nicotine dependence, cigarettes, uncomplicated: Secondary | ICD-10-CM | POA: Diagnosis not present

## 2014-09-25 DIAGNOSIS — I2510 Atherosclerotic heart disease of native coronary artery without angina pectoris: Secondary | ICD-10-CM | POA: Diagnosis not present

## 2014-10-15 DIAGNOSIS — I70219 Atherosclerosis of native arteries of extremities with intermittent claudication, unspecified extremity: Secondary | ICD-10-CM | POA: Diagnosis not present

## 2014-10-15 DIAGNOSIS — Z720 Tobacco use: Secondary | ICD-10-CM | POA: Diagnosis not present

## 2014-10-15 DIAGNOSIS — R6889 Other general symptoms and signs: Secondary | ICD-10-CM | POA: Diagnosis not present

## 2014-10-15 DIAGNOSIS — Z6823 Body mass index (BMI) 23.0-23.9, adult: Secondary | ICD-10-CM | POA: Diagnosis not present

## 2014-10-15 DIAGNOSIS — Z23 Encounter for immunization: Secondary | ICD-10-CM | POA: Diagnosis not present

## 2014-10-31 DIAGNOSIS — I739 Peripheral vascular disease, unspecified: Secondary | ICD-10-CM | POA: Diagnosis not present

## 2014-11-12 DIAGNOSIS — I739 Peripheral vascular disease, unspecified: Secondary | ICD-10-CM | POA: Diagnosis not present

## 2014-11-12 DIAGNOSIS — Z95810 Presence of automatic (implantable) cardiac defibrillator: Secondary | ICD-10-CM | POA: Diagnosis not present

## 2014-11-12 DIAGNOSIS — I10 Essential (primary) hypertension: Secondary | ICD-10-CM | POA: Diagnosis not present

## 2014-11-12 DIAGNOSIS — I429 Cardiomyopathy, unspecified: Secondary | ICD-10-CM | POA: Diagnosis not present

## 2014-11-12 DIAGNOSIS — I2510 Atherosclerotic heart disease of native coronary artery without angina pectoris: Secondary | ICD-10-CM | POA: Diagnosis not present

## 2014-11-14 DIAGNOSIS — F17210 Nicotine dependence, cigarettes, uncomplicated: Secondary | ICD-10-CM | POA: Diagnosis not present

## 2014-11-14 DIAGNOSIS — I739 Peripheral vascular disease, unspecified: Secondary | ICD-10-CM | POA: Diagnosis not present

## 2014-11-14 DIAGNOSIS — I10 Essential (primary) hypertension: Secondary | ICD-10-CM | POA: Diagnosis not present

## 2014-11-14 DIAGNOSIS — I2510 Atherosclerotic heart disease of native coronary artery without angina pectoris: Secondary | ICD-10-CM | POA: Diagnosis not present

## 2014-11-20 DIAGNOSIS — Z01818 Encounter for other preprocedural examination: Secondary | ICD-10-CM | POA: Diagnosis not present

## 2014-11-20 DIAGNOSIS — M47814 Spondylosis without myelopathy or radiculopathy, thoracic region: Secondary | ICD-10-CM | POA: Diagnosis not present

## 2014-11-20 DIAGNOSIS — I739 Peripheral vascular disease, unspecified: Secondary | ICD-10-CM | POA: Diagnosis not present

## 2014-11-21 DIAGNOSIS — Z79899 Other long term (current) drug therapy: Secondary | ICD-10-CM | POA: Diagnosis not present

## 2014-11-21 DIAGNOSIS — Z7982 Long term (current) use of aspirin: Secondary | ICD-10-CM | POA: Diagnosis not present

## 2014-11-21 DIAGNOSIS — I7092 Chronic total occlusion of artery of the extremities: Secondary | ICD-10-CM | POA: Diagnosis not present

## 2014-11-21 DIAGNOSIS — I743 Embolism and thrombosis of arteries of the lower extremities: Secondary | ICD-10-CM | POA: Diagnosis not present

## 2014-11-21 DIAGNOSIS — I10 Essential (primary) hypertension: Secondary | ICD-10-CM | POA: Diagnosis not present

## 2014-11-21 DIAGNOSIS — I70212 Atherosclerosis of native arteries of extremities with intermittent claudication, left leg: Secondary | ICD-10-CM | POA: Diagnosis not present

## 2014-11-21 DIAGNOSIS — I428 Other cardiomyopathies: Secondary | ICD-10-CM | POA: Diagnosis not present

## 2014-11-22 DIAGNOSIS — I743 Embolism and thrombosis of arteries of the lower extremities: Secondary | ICD-10-CM | POA: Diagnosis not present

## 2014-11-22 DIAGNOSIS — Z79899 Other long term (current) drug therapy: Secondary | ICD-10-CM | POA: Diagnosis not present

## 2014-11-22 DIAGNOSIS — Z7982 Long term (current) use of aspirin: Secondary | ICD-10-CM | POA: Diagnosis not present

## 2014-11-22 DIAGNOSIS — I10 Essential (primary) hypertension: Secondary | ICD-10-CM | POA: Diagnosis not present

## 2014-11-30 DIAGNOSIS — R7989 Other specified abnormal findings of blood chemistry: Secondary | ICD-10-CM | POA: Diagnosis not present

## 2014-12-13 DIAGNOSIS — I739 Peripheral vascular disease, unspecified: Secondary | ICD-10-CM | POA: Diagnosis not present

## 2015-01-08 DIAGNOSIS — N503 Cyst of epididymis: Secondary | ICD-10-CM | POA: Diagnosis not present

## 2015-01-08 DIAGNOSIS — N50812 Left testicular pain: Secondary | ICD-10-CM | POA: Diagnosis not present

## 2015-01-08 DIAGNOSIS — N390 Urinary tract infection, site not specified: Secondary | ICD-10-CM | POA: Diagnosis not present

## 2015-01-08 DIAGNOSIS — N453 Epididymo-orchitis: Secondary | ICD-10-CM | POA: Diagnosis not present

## 2015-01-15 DIAGNOSIS — I498 Other specified cardiac arrhythmias: Secondary | ICD-10-CM | POA: Diagnosis not present

## 2015-01-15 DIAGNOSIS — Z4502 Encounter for adjustment and management of automatic implantable cardiac defibrillator: Secondary | ICD-10-CM | POA: Diagnosis not present

## 2015-03-13 ENCOUNTER — Other Ambulatory Visit: Payer: Self-pay

## 2015-03-13 DIAGNOSIS — J441 Chronic obstructive pulmonary disease with (acute) exacerbation: Secondary | ICD-10-CM

## 2015-03-13 NOTE — Patient Outreach (Signed)
[facility]) Care Management  [facility] Care Manager  03/13/2015   Evan Weeks [birth year] [id number]  Telephone call for screening due to high ED utilization.  Explanation of [facility] services provided.  Patient is accepting of [facility] Health Coach disease management for COPD.  Admission assessment partially completed today.  Current Medications:  Current Outpatient Prescriptions  Medication Sig Dispense Refill  . albuterol-ipratropium (COMBIVENT) 18-103 MCG/ACT inhaler Inhale 1 puff into the lungs every 4 (four) hours as needed for wheezing or shortness of breath.    Marland Kitchen aspirin 81 MG tablet Take 81 mg by mouth daily.      Marland Kitchen atorvastatin (LIPITOR) 40 MG tablet Take 40 mg by mouth at bedtime.      . carvedilol (COREG) 25 MG tablet Take 25 mg by mouth 2 (two) times daily with a meal.      . clopidogrel (PLAVIX) 75 MG tablet TAKE 1 TABLET BY MOUTH EVERY DAY 30 tablet 3  . fish oil-omega-3 fatty acids 1000 MG capsule Take 2 g by mouth daily.      Marland Kitchen GARLIC PO Take by mouth daily.      Marland Kitchen lisinopril (PRINIVIL,ZESTRIL) 20 MG tablet Take 20 mg by mouth daily.      . vitamin B-12 (CYANOCOBALAMIN) 500 MCG tablet Take 500 mcg by mouth daily.       No current facility-administered medications for this visit.    Functional Status:  In your present state of health, do you have any difficulty performing the following activities: 03/13/2015  Hearing? N  Vision? Y  Difficulty concentrating or making decisions? N  Walking or climbing stairs? N  Dressing or bathing? N  Doing errands, shopping? N    Fall/Depression Screening: PHQ 2/9 Scores 03/13/2015  PHQ - 2 Score 0   [facility] CM Care Plan Problem One        Most Recent Value   Care Plan Problem One  Chronic disease management needs related to COPD management   Role Documenting the Problem One  Health Coach   Care Plan for Problem One  Active   [facility]N Long Term Goal (31-90 days)  Patient will develop long term plan of care for management of COPD  over the next 60 days.   [facility]N Long Term Goal Start Date  03/13/15   Interventions for Problem One Long Term Goal  Discussion re symptom management.  Will mail COPD packet with Action Plan.   [facility] CM Short Term Goal #1 (0-30 days)  Patient will be able to name 3 'Yellow Zone' symptoms to report to PCP within 30 days.   [facility] CM Short Term Goal #1 Start Date  03/13/15   Interventions for Short Term Goal #1  Will mail COPD Action Plan.       Assessment: Patient has chronic disease management needs related to COPD.  Plan: Admit for [facility] Health Coach services.           RN will mail Welcome packet and COPD patient education folder.           RN will follow up within 2 weeks to complete assessment.  Tyler Deis, RN, MSN RN [employer] [phone] Fax [phone]

## 2015-03-13 NOTE — Patient Outreach (Signed)
[facility]) Care Management  03/13/2015  OBED METZEN 1945-06-07 [id number]   Unsuccessful attempt to reach patient referred for high ED utilization.  The person who answered the phone stated it was a wrong number.  RN will contact PCP to ask about current contact information.  Tyler Deis, RN, MSN RN [employer] [phone] Fax [phone]

## 2015-03-27 ENCOUNTER — Other Ambulatory Visit: Payer: Self-pay

## 2015-03-27 NOTE — Patient Outreach (Signed)
[facility]) Care Management  03/27/2015  Evan Weeks 30-Apr-1945 [id number]  Unsuccessful attempt to reach patient for scheduled assessment.  HIPPA appropriate message left requesting call back.  If no response RN will make another attempt within 5 working days.  Tyler Deis, RN, MSN RN [employer] [phone] Fax [phone]

## 2015-03-27 NOTE — Patient Outreach (Signed)
[facility]) Care Management  03/27/2015  MORGON DALEIDEN [birth year] [id number]   Another unsuccessful attempt to reach patient.  HIPPA appropriate message left.  RN will make another attempt within 5 working days.  Tyler Deis, RN, MSN RN [employer] [phone] Fax [phone]

## 2015-04-03 ENCOUNTER — Other Ambulatory Visit: Payer: Self-pay

## 2015-04-03 ENCOUNTER — Ambulatory Visit: Payer: Self-pay

## 2015-04-03 DIAGNOSIS — J441 Chronic obstructive pulmonary disease with (acute) exacerbation: Secondary | ICD-10-CM

## 2015-04-03 DIAGNOSIS — J439 Emphysema, unspecified: Secondary | ICD-10-CM

## 2015-04-03 NOTE — Patient Outreach (Signed)
[facility]) Care Management  04/03/2015  Evan Weeks September 30, 1945 [id number]   Patient returned call from message left earlier today.  Reports he is doing well- he remains independent and able to go out.  No acute symptoms of COPD.  Education provided re use of COPD Action Plan.  Plan:  Patient encouraged to study and use COPD Action Plan.           Evan will follow up in approximately one month.  Tyler Deis, RN, MSN Evan Weeks [phone] Fax [phone]

## 2015-04-03 NOTE — Patient Outreach (Signed)
[facility]) Care Management  04/03/2015  DOMINYC PEARSON 1945-01-25 [id number]   Another unsuccessful attempt to reach patient by phone.  HIPPA appropriate message left requesting call back. If no response  RN will make another attempt within 5 working days.  Tyler Deis, RN, MSN RN [employer] [phone] Fax [phone]

## 2015-04-18 DIAGNOSIS — I509 Heart failure, unspecified: Secondary | ICD-10-CM | POA: Diagnosis not present

## 2015-04-18 DIAGNOSIS — J449 Chronic obstructive pulmonary disease, unspecified: Secondary | ICD-10-CM | POA: Diagnosis not present

## 2015-04-18 DIAGNOSIS — I10 Essential (primary) hypertension: Secondary | ICD-10-CM | POA: Diagnosis not present

## 2015-04-18 DIAGNOSIS — E785 Hyperlipidemia, unspecified: Secondary | ICD-10-CM | POA: Diagnosis not present

## 2015-05-02 ENCOUNTER — Other Ambulatory Visit: Payer: Self-pay

## 2015-05-02 VITALS — Wt 146.0 lb

## 2015-05-02 DIAGNOSIS — J439 Emphysema, unspecified: Secondary | ICD-10-CM

## 2015-05-02 NOTE — Patient Outreach (Signed)
[facility]) Care Management  05/02/2015  MARCKUS STEFANOVICH August 22, 1945 [id number]   Telephone contact today.  Patient reports he is doing well, with no symptoms of exacerbation of COPD.  He remains active- he drives and goes out often.  Patient reports implementing COPD Action Plan.  He reports he has been unable to stop smoking despite trying patches and pills.  He expressed interest in going to the [employer] program at [facility].  Plan: Patient will continue to use COPD Action Plan to self-manage his COPD.          Patient will pursue participation in local smoking cessation program.          RN will contact PCP to request referral to smoking cessation program.          RN will mail EMMI educational materials on smoking cessation.          RN will mail Advance Directives information packet to patient.          RN will follow up in approximately one month.  Tyler Deis, RN, MSN RN [employer] [phone] Fax [phone]

## 2015-05-16 DIAGNOSIS — R609 Edema, unspecified: Secondary | ICD-10-CM | POA: Diagnosis not present

## 2015-05-16 DIAGNOSIS — I509 Heart failure, unspecified: Secondary | ICD-10-CM | POA: Diagnosis not present

## 2015-05-16 DIAGNOSIS — Z1389 Encounter for screening for other disorder: Secondary | ICD-10-CM | POA: Diagnosis not present

## 2015-05-23 DIAGNOSIS — I509 Heart failure, unspecified: Secondary | ICD-10-CM | POA: Diagnosis not present

## 2015-05-23 DIAGNOSIS — Z6824 Body mass index (BMI) 24.0-24.9, adult: Secondary | ICD-10-CM | POA: Diagnosis not present

## 2015-05-30 ENCOUNTER — Other Ambulatory Visit: Payer: Self-pay

## 2015-05-30 NOTE — Patient Outreach (Signed)
[facility]) Care Management  05/30/2015  EWIN ROYSTER Dec 31, 1945 [id number]   Unsuccessful attempt to reach patient for scheduled contact.  HIPPA appropriate message left requesting call back.  If no response RN will make another attempt within one week.  Tyler Deis, RN, MSN RN [employer] [phone] Fax [phone]

## 2015-06-05 ENCOUNTER — Other Ambulatory Visit: Payer: Self-pay

## 2015-06-05 DIAGNOSIS — J439 Emphysema, unspecified: Secondary | ICD-10-CM

## 2015-06-05 NOTE — Patient Outreach (Signed)
[facility]) Care Management  [facility] Care Manager  06/05/2015   Evan Weeks 1945/03/02 [id number]  Telephonic contact with patient.  He reports a recent episode of swelling in his lower extremities and increased shortness of breath.  He did contact his PCP, and was seen in the office.  Lasix dosage was increased and condition has improved.  He reports he still has some pedal edema, which goes down when he elevates his feet and overnight.  Patient reports he has an appointment with his cardiologist tomorrow.  Patient reports no current "Yellow Zone" symptoms related to his COPD.  Encounter Medications:  Outpatient Encounter Prescriptions as of 06/05/2015  Medication Sig Note  . furosemide (LASIX) 40 MG tablet Take 40 mg by mouth 2 (two) times daily. 06/05/2015: Taking 2 tabs in am, 1 tab in pm.  . albuterol-ipratropium (COMBIVENT) 18-103 MCG/ACT inhaler Inhale 1 puff into the lungs every 4 (four) hours as needed for wheezing or shortness of breath.   Marland Kitchen aspirin 81 MG tablet Take 81 mg by mouth daily.     Marland Kitchen atorvastatin (LIPITOR) 40 MG tablet Take 40 mg by mouth at bedtime.     . carvedilol (COREG) 25 MG tablet Take 25 mg by mouth 2 (two) times daily with a meal.     . clopidogrel (PLAVIX) 75 MG tablet TAKE 1 TABLET BY MOUTH EVERY DAY   . fish oil-omega-3 fatty acids 1000 MG capsule Take 2 g by mouth daily.     Marland Kitchen GARLIC PO Take by mouth daily.     Marland Kitchen lisinopril (PRINIVIL,ZESTRIL) 20 MG tablet Take 20 mg by mouth daily.     . vitamin B-12 (CYANOCOBALAMIN) 500 MCG tablet Take 500 mcg by mouth daily.      No facility-administered encounter medications on file as of 06/05/2015.    Assessment: Patient is using his COPD Action Plan.  Plan: Patient will keep medical appointments.           Patient will ask PCP for referral to [employer] smoking cessation program.           Patient will continue to use COPD Action Plan.           RN will follow up in approximately one  month.  Tyler Deis, RN, MSN RN [employer] [phone] Fax [phone]

## 2015-06-06 DIAGNOSIS — Z4502 Encounter for adjustment and management of automatic implantable cardiac defibrillator: Secondary | ICD-10-CM | POA: Diagnosis not present

## 2015-06-06 DIAGNOSIS — I429 Cardiomyopathy, unspecified: Secondary | ICD-10-CM | POA: Diagnosis not present

## 2015-06-10 DIAGNOSIS — R600 Localized edema: Secondary | ICD-10-CM | POA: Diagnosis not present

## 2015-06-10 DIAGNOSIS — R609 Edema, unspecified: Secondary | ICD-10-CM | POA: Diagnosis not present

## 2015-06-10 DIAGNOSIS — R0602 Shortness of breath: Secondary | ICD-10-CM | POA: Diagnosis not present

## 2015-06-10 DIAGNOSIS — H16002 Unspecified corneal ulcer, left eye: Secondary | ICD-10-CM | POA: Diagnosis not present

## 2015-06-14 DIAGNOSIS — H179 Unspecified corneal scar and opacity: Secondary | ICD-10-CM | POA: Diagnosis not present

## 2015-06-14 DIAGNOSIS — B0052 Herpesviral keratitis: Secondary | ICD-10-CM | POA: Diagnosis not present

## 2015-06-14 DIAGNOSIS — H40003 Preglaucoma, unspecified, bilateral: Secondary | ICD-10-CM | POA: Diagnosis not present

## 2015-06-28 DIAGNOSIS — I2510 Atherosclerotic heart disease of native coronary artery without angina pectoris: Secondary | ICD-10-CM | POA: Diagnosis not present

## 2015-06-28 DIAGNOSIS — I429 Cardiomyopathy, unspecified: Secondary | ICD-10-CM | POA: Diagnosis not present

## 2015-06-28 DIAGNOSIS — I10 Essential (primary) hypertension: Secondary | ICD-10-CM | POA: Diagnosis not present

## 2015-06-28 DIAGNOSIS — Z95810 Presence of automatic (implantable) cardiac defibrillator: Secondary | ICD-10-CM | POA: Diagnosis not present

## 2015-07-03 ENCOUNTER — Other Ambulatory Visit: Payer: Self-pay

## 2015-07-03 VITALS — Wt 132.0 lb

## 2015-07-03 DIAGNOSIS — J439 Emphysema, unspecified: Secondary | ICD-10-CM

## 2015-07-03 NOTE — Patient Outreach (Signed)
[facility]) Care Management  07/03/2015  DIRRICK LAX 02-05-1945 [id number]   Telephone call to patient.  He reports shortness of breath today, and that he is out of his inhaler.  States the inhaler cannot be refilled until the first of July. Patient states he has MD appointment for tomorrow.  Plan:  Patient will keep medical appointments.           Patient will use COPD Action Plan to manage COPD.           RN will contact PCP re patient's SOB and need for Combivent order.  Tyler Deis, RN, MSN RN Health [facility] [phone] Fax [phone]  Note:  RN called PCP.  Left message on Nurse Line re patient status and need for Combivent.

## 2015-07-04 DIAGNOSIS — Z95810 Presence of automatic (implantable) cardiac defibrillator: Secondary | ICD-10-CM | POA: Diagnosis not present

## 2015-07-04 DIAGNOSIS — I2510 Atherosclerotic heart disease of native coronary artery without angina pectoris: Secondary | ICD-10-CM | POA: Diagnosis not present

## 2015-07-04 DIAGNOSIS — I429 Cardiomyopathy, unspecified: Secondary | ICD-10-CM | POA: Diagnosis not present

## 2015-07-04 DIAGNOSIS — I10 Essential (primary) hypertension: Secondary | ICD-10-CM | POA: Diagnosis not present

## 2015-07-09 ENCOUNTER — Other Ambulatory Visit: Payer: Self-pay

## 2015-07-09 DIAGNOSIS — J439 Emphysema, unspecified: Secondary | ICD-10-CM

## 2015-07-09 NOTE — Patient Outreach (Signed)
[facility]) Care Management  07/09/2015  Evan Weeks April 01, 1945 [id number]  Telephone call to recheck respiratory status.  On 07-03-15  patient reported respiratory SOB due to being out of his rescue inhaler.  RN contacted his PCP on that date to report same. Patient states the PCP office called him that day and reordered his Combivent.  Patient reports no problems with breathing today.  Plan:  Patient will keep MD appointments, including R. Krasowski on 07-23-15.           Patient will continue to attempt to cut down on smoking.           Patient will use his COPD Action Plan to manage his condition.           RN will follow up in approximately one month.  Tyler Deis, RN, MSN RN [employer] [phone] Fax [phone]

## 2015-07-23 DIAGNOSIS — I739 Peripheral vascular disease, unspecified: Secondary | ICD-10-CM | POA: Diagnosis not present

## 2015-07-23 DIAGNOSIS — I420 Dilated cardiomyopathy: Secondary | ICD-10-CM | POA: Diagnosis not present

## 2015-07-23 DIAGNOSIS — Z95810 Presence of automatic (implantable) cardiac defibrillator: Secondary | ICD-10-CM | POA: Diagnosis not present

## 2015-07-23 DIAGNOSIS — I2510 Atherosclerotic heart disease of native coronary artery without angina pectoris: Secondary | ICD-10-CM | POA: Diagnosis not present

## 2015-08-06 ENCOUNTER — Other Ambulatory Visit: Payer: Self-pay

## 2015-08-06 VITALS — Wt 145.0 lb

## 2015-08-06 DIAGNOSIS — J439 Emphysema, unspecified: Secondary | ICD-10-CM

## 2015-08-06 NOTE — Patient Outreach (Signed)
[facility]) Care Management  08/06/2015  Evan Weeks May 28, 1945 [id number]   Telephone assessment with patient.  He reports his breathing is 'good', with no acute symptoms. Continues to use his rescue inhaler about 1-2 times per day.  Patient continues to smoke approximately 10 cigarettes per day.  Patient reports he saw his cardiologist on 07-23-15 for recheck, and that he has an appointment with PCP on 09-18-15.  Plan:  Patient will continue to use COPD Action Plan to monitor respiratory status.           Patient will keep medical appointments.           Patient will continue to attempt a decrease in cigarettes smoked per day.           RN will do monthly follow up in August.  Tyler Deis, RN, MSN RN [facility] [employer] [phone] Fax [phone]

## 2015-08-14 DIAGNOSIS — E785 Hyperlipidemia, unspecified: Secondary | ICD-10-CM | POA: Diagnosis not present

## 2015-08-14 DIAGNOSIS — I10 Essential (primary) hypertension: Secondary | ICD-10-CM | POA: Diagnosis not present

## 2015-08-22 DIAGNOSIS — J449 Chronic obstructive pulmonary disease, unspecified: Secondary | ICD-10-CM | POA: Diagnosis not present

## 2015-08-22 DIAGNOSIS — E785 Hyperlipidemia, unspecified: Secondary | ICD-10-CM | POA: Diagnosis not present

## 2015-08-22 DIAGNOSIS — I10 Essential (primary) hypertension: Secondary | ICD-10-CM | POA: Diagnosis not present

## 2015-08-22 DIAGNOSIS — I509 Heart failure, unspecified: Secondary | ICD-10-CM | POA: Diagnosis not present

## 2015-09-03 ENCOUNTER — Other Ambulatory Visit: Payer: Self-pay

## 2015-09-03 DIAGNOSIS — J439 Emphysema, unspecified: Secondary | ICD-10-CM

## 2015-09-03 NOTE — Patient Outreach (Signed)
[facility]) Care Management  09/03/2015  MELODY KIRCHER 1945/03/03 [id number]     09/03/2015  OLUWATAMILORE MONN 1945/11/02 [id number]   Contact today with patient with COPD.  He continues to attempt to decrease cigarettes smoked per day. Patient reports no acute or changing symptoms of respiratory disease. He reports he saw PCP Dr. Aurora Mask about 2 weeks ago and got a 'good report'.  Plan:  Patient will continue efforts to decrease smoking.           Patient will continue to use COPD Action Plan to manage respiratory condition.           RN will follow up in the month of September.  Tyler Deis, RN, MSN RN [employer] [phone] Fax [phone]

## 2015-09-09 DIAGNOSIS — Z95810 Presence of automatic (implantable) cardiac defibrillator: Secondary | ICD-10-CM | POA: Diagnosis not present

## 2015-10-01 ENCOUNTER — Other Ambulatory Visit: Payer: Self-pay

## 2015-10-01 DIAGNOSIS — I420 Dilated cardiomyopathy: Secondary | ICD-10-CM | POA: Diagnosis not present

## 2015-10-01 DIAGNOSIS — Z95810 Presence of automatic (implantable) cardiac defibrillator: Secondary | ICD-10-CM | POA: Diagnosis not present

## 2015-10-01 DIAGNOSIS — I2510 Atherosclerotic heart disease of native coronary artery without angina pectoris: Secondary | ICD-10-CM | POA: Diagnosis not present

## 2015-10-01 DIAGNOSIS — I10 Essential (primary) hypertension: Secondary | ICD-10-CM | POA: Diagnosis not present

## 2015-10-01 NOTE — Patient Outreach (Signed)
[facility]) Care Management  10/01/2015  SOHRAB VANNELLI 06-28-1945 [id number]   Unsuccessful attempt to reach patient.  Unable to leave a message.  RN will make another attempt within 10 working days.  Tyler Deis, RN, MSN RN [employer] [phone] Fax [phone]

## 2015-10-08 ENCOUNTER — Ambulatory Visit: Payer: Self-pay

## 2015-10-09 DIAGNOSIS — I2510 Atherosclerotic heart disease of native coronary artery without angina pectoris: Secondary | ICD-10-CM | POA: Diagnosis not present

## 2015-10-09 DIAGNOSIS — I420 Dilated cardiomyopathy: Secondary | ICD-10-CM | POA: Diagnosis not present

## 2015-10-09 DIAGNOSIS — Z95810 Presence of automatic (implantable) cardiac defibrillator: Secondary | ICD-10-CM | POA: Diagnosis not present

## 2015-10-09 DIAGNOSIS — I10 Essential (primary) hypertension: Secondary | ICD-10-CM | POA: Diagnosis not present

## 2015-10-10 ENCOUNTER — Other Ambulatory Visit: Payer: Self-pay

## 2015-10-10 NOTE — Patient Outreach (Signed)
[facility]) Care Management  10/10/2015  Evan Weeks 06-12-1945 [id number]  Second unsuccessful attempt to reach patient by phone.  HIPAA appropriate message left requesting call back. If no response RN will make another attempt within 10 working days.  Tyler Deis, RN, MSN RN [employer] [phone] Fax [phone]

## 2015-10-22 ENCOUNTER — Other Ambulatory Visit: Payer: Self-pay

## 2015-10-22 DIAGNOSIS — J439 Emphysema, unspecified: Secondary | ICD-10-CM

## 2015-10-22 NOTE — Patient Outreach (Signed)
[facility]) Care Management  10/22/2015  Evan Weeks 09-30-1945 [id number]  Telephone contact with patient after two unsuccessful attempts to reach him in September.  He reports he is doing well.  No changes or acute problems with respiratory disease.  Patient states he continues to smoke about 1/2 pack per day, and he is no longer interested in trying to quit.  States he has tried repeatedly and never succeeded.  Discussion with patient about case closure.  He is agreeable to same.  Encouraged him to call if he has a need for [facility] services in the future.  Encouraged him to continue using COPD Action Plan to self-manage his COPD.  Plan:  Case closure.           Notify CMA and PCP of case closure.  Tyler Deis, RN, MSN RN [employer] [phone] Fax [phone]

## 2015-12-09 DIAGNOSIS — Z95810 Presence of automatic (implantable) cardiac defibrillator: Secondary | ICD-10-CM | POA: Diagnosis not present

## 2015-12-24 DIAGNOSIS — E785 Hyperlipidemia, unspecified: Secondary | ICD-10-CM | POA: Diagnosis not present

## 2015-12-24 DIAGNOSIS — I10 Essential (primary) hypertension: Secondary | ICD-10-CM | POA: Diagnosis not present

## 2015-12-28 DIAGNOSIS — N189 Chronic kidney disease, unspecified: Secondary | ICD-10-CM | POA: Diagnosis not present

## 2015-12-28 DIAGNOSIS — I10 Essential (primary) hypertension: Secondary | ICD-10-CM | POA: Diagnosis not present

## 2015-12-28 DIAGNOSIS — I447 Left bundle-branch block, unspecified: Secondary | ICD-10-CM

## 2015-12-28 DIAGNOSIS — J441 Chronic obstructive pulmonary disease with (acute) exacerbation: Secondary | ICD-10-CM | POA: Diagnosis not present

## 2015-12-28 DIAGNOSIS — I429 Cardiomyopathy, unspecified: Secondary | ICD-10-CM | POA: Diagnosis not present

## 2015-12-28 DIAGNOSIS — N400 Enlarged prostate without lower urinary tract symptoms: Secondary | ICD-10-CM | POA: Diagnosis not present

## 2015-12-28 DIAGNOSIS — R0609 Other forms of dyspnea: Secondary | ICD-10-CM | POA: Diagnosis not present

## 2015-12-28 DIAGNOSIS — E785 Hyperlipidemia, unspecified: Secondary | ICD-10-CM | POA: Diagnosis not present

## 2015-12-28 DIAGNOSIS — I2510 Atherosclerotic heart disease of native coronary artery without angina pectoris: Secondary | ICD-10-CM | POA: Diagnosis not present

## 2015-12-28 DIAGNOSIS — R0602 Shortness of breath: Secondary | ICD-10-CM | POA: Diagnosis not present

## 2015-12-28 DIAGNOSIS — Z951 Presence of aortocoronary bypass graft: Secondary | ICD-10-CM | POA: Diagnosis not present

## 2015-12-28 DIAGNOSIS — K219 Gastro-esophageal reflux disease without esophagitis: Secondary | ICD-10-CM | POA: Diagnosis not present

## 2015-12-28 DIAGNOSIS — Z79899 Other long term (current) drug therapy: Secondary | ICD-10-CM | POA: Diagnosis not present

## 2015-12-28 DIAGNOSIS — I252 Old myocardial infarction: Secondary | ICD-10-CM | POA: Diagnosis not present

## 2015-12-28 DIAGNOSIS — I110 Hypertensive heart disease with heart failure: Secondary | ICD-10-CM | POA: Diagnosis not present

## 2015-12-28 DIAGNOSIS — I509 Heart failure, unspecified: Secondary | ICD-10-CM | POA: Diagnosis not present

## 2015-12-28 DIAGNOSIS — I130 Hypertensive heart and chronic kidney disease with heart failure and stage 1 through stage 4 chronic kidney disease, or unspecified chronic kidney disease: Secondary | ICD-10-CM | POA: Diagnosis not present

## 2015-12-28 DIAGNOSIS — Z95810 Presence of automatic (implantable) cardiac defibrillator: Secondary | ICD-10-CM | POA: Diagnosis not present

## 2015-12-28 DIAGNOSIS — I5023 Acute on chronic systolic (congestive) heart failure: Secondary | ICD-10-CM | POA: Diagnosis not present

## 2016-01-03 DIAGNOSIS — I509 Heart failure, unspecified: Secondary | ICD-10-CM | POA: Diagnosis not present

## 2016-01-03 DIAGNOSIS — Z6824 Body mass index (BMI) 24.0-24.9, adult: Secondary | ICD-10-CM | POA: Diagnosis not present

## 2016-01-03 DIAGNOSIS — J449 Chronic obstructive pulmonary disease, unspecified: Secondary | ICD-10-CM | POA: Diagnosis not present

## 2016-01-17 DIAGNOSIS — I509 Heart failure, unspecified: Secondary | ICD-10-CM | POA: Diagnosis not present

## 2016-01-17 DIAGNOSIS — J441 Chronic obstructive pulmonary disease with (acute) exacerbation: Secondary | ICD-10-CM | POA: Diagnosis not present

## 2016-02-17 DIAGNOSIS — J441 Chronic obstructive pulmonary disease with (acute) exacerbation: Secondary | ICD-10-CM | POA: Diagnosis not present

## 2016-02-17 DIAGNOSIS — I509 Heart failure, unspecified: Secondary | ICD-10-CM | POA: Diagnosis not present

## 2016-03-10 DIAGNOSIS — Z95810 Presence of automatic (implantable) cardiac defibrillator: Secondary | ICD-10-CM | POA: Diagnosis not present

## 2016-03-16 DIAGNOSIS — J441 Chronic obstructive pulmonary disease with (acute) exacerbation: Secondary | ICD-10-CM | POA: Diagnosis not present

## 2016-03-16 DIAGNOSIS — I509 Heart failure, unspecified: Secondary | ICD-10-CM | POA: Diagnosis not present

## 2016-04-01 DIAGNOSIS — I420 Dilated cardiomyopathy: Secondary | ICD-10-CM | POA: Diagnosis not present

## 2016-04-01 DIAGNOSIS — I739 Peripheral vascular disease, unspecified: Secondary | ICD-10-CM | POA: Diagnosis not present

## 2016-04-01 DIAGNOSIS — I10 Essential (primary) hypertension: Secondary | ICD-10-CM | POA: Diagnosis not present

## 2016-04-01 DIAGNOSIS — I2510 Atherosclerotic heart disease of native coronary artery without angina pectoris: Secondary | ICD-10-CM | POA: Diagnosis not present

## 2016-04-01 DIAGNOSIS — Z95810 Presence of automatic (implantable) cardiac defibrillator: Secondary | ICD-10-CM | POA: Diagnosis not present

## 2016-04-13 DIAGNOSIS — I2510 Atherosclerotic heart disease of native coronary artery without angina pectoris: Secondary | ICD-10-CM | POA: Diagnosis not present

## 2016-04-16 DIAGNOSIS — Z0000 Encounter for general adult medical examination without abnormal findings: Secondary | ICD-10-CM | POA: Diagnosis not present

## 2016-04-16 DIAGNOSIS — K219 Gastro-esophageal reflux disease without esophagitis: Secondary | ICD-10-CM | POA: Diagnosis not present

## 2016-04-16 DIAGNOSIS — J449 Chronic obstructive pulmonary disease, unspecified: Secondary | ICD-10-CM | POA: Diagnosis not present

## 2016-04-16 DIAGNOSIS — I10 Essential (primary) hypertension: Secondary | ICD-10-CM | POA: Diagnosis not present

## 2016-04-16 DIAGNOSIS — E785 Hyperlipidemia, unspecified: Secondary | ICD-10-CM | POA: Diagnosis not present

## 2016-04-16 DIAGNOSIS — J441 Chronic obstructive pulmonary disease with (acute) exacerbation: Secondary | ICD-10-CM | POA: Diagnosis not present

## 2016-04-16 DIAGNOSIS — I509 Heart failure, unspecified: Secondary | ICD-10-CM | POA: Diagnosis not present

## 2016-05-16 DIAGNOSIS — J441 Chronic obstructive pulmonary disease with (acute) exacerbation: Secondary | ICD-10-CM | POA: Diagnosis not present

## 2016-05-16 DIAGNOSIS — I509 Heart failure, unspecified: Secondary | ICD-10-CM | POA: Diagnosis not present

## 2016-05-28 DIAGNOSIS — N179 Acute kidney failure, unspecified: Secondary | ICD-10-CM | POA: Diagnosis not present

## 2016-05-28 DIAGNOSIS — I252 Old myocardial infarction: Secondary | ICD-10-CM | POA: Diagnosis not present

## 2016-05-28 DIAGNOSIS — I110 Hypertensive heart disease with heart failure: Secondary | ICD-10-CM | POA: Diagnosis not present

## 2016-05-28 DIAGNOSIS — I5043 Acute on chronic combined systolic (congestive) and diastolic (congestive) heart failure: Secondary | ICD-10-CM | POA: Diagnosis not present

## 2016-05-28 DIAGNOSIS — E785 Hyperlipidemia, unspecified: Secondary | ICD-10-CM | POA: Diagnosis not present

## 2016-05-28 DIAGNOSIS — R079 Chest pain, unspecified: Secondary | ICD-10-CM | POA: Diagnosis not present

## 2016-05-28 DIAGNOSIS — R918 Other nonspecific abnormal finding of lung field: Secondary | ICD-10-CM | POA: Diagnosis not present

## 2016-05-28 DIAGNOSIS — J9690 Respiratory failure, unspecified, unspecified whether with hypoxia or hypercapnia: Secondary | ICD-10-CM | POA: Diagnosis not present

## 2016-05-28 DIAGNOSIS — Z79899 Other long term (current) drug therapy: Secondary | ICD-10-CM | POA: Diagnosis not present

## 2016-05-28 DIAGNOSIS — I517 Cardiomegaly: Secondary | ICD-10-CM | POA: Diagnosis not present

## 2016-05-28 DIAGNOSIS — J449 Chronic obstructive pulmonary disease, unspecified: Secondary | ICD-10-CM | POA: Diagnosis not present

## 2016-05-28 DIAGNOSIS — M7989 Other specified soft tissue disorders: Secondary | ICD-10-CM | POA: Diagnosis not present

## 2016-05-28 DIAGNOSIS — N189 Chronic kidney disease, unspecified: Secondary | ICD-10-CM | POA: Diagnosis not present

## 2016-05-28 DIAGNOSIS — E875 Hyperkalemia: Secondary | ICD-10-CM | POA: Diagnosis not present

## 2016-05-28 DIAGNOSIS — J441 Chronic obstructive pulmonary disease with (acute) exacerbation: Secondary | ICD-10-CM | POA: Diagnosis not present

## 2016-05-28 DIAGNOSIS — Z951 Presence of aortocoronary bypass graft: Secondary | ICD-10-CM | POA: Diagnosis not present

## 2016-05-28 DIAGNOSIS — Z950 Presence of cardiac pacemaker: Secondary | ICD-10-CM | POA: Diagnosis not present

## 2016-05-28 DIAGNOSIS — I472 Ventricular tachycardia: Secondary | ICD-10-CM | POA: Diagnosis not present

## 2016-05-28 DIAGNOSIS — I25810 Atherosclerosis of coronary artery bypass graft(s) without angina pectoris: Secondary | ICD-10-CM | POA: Diagnosis not present

## 2016-05-28 DIAGNOSIS — I509 Heart failure, unspecified: Secondary | ICD-10-CM | POA: Diagnosis not present

## 2016-05-28 DIAGNOSIS — I081 Rheumatic disorders of both mitral and tricuspid valves: Secondary | ICD-10-CM | POA: Diagnosis not present

## 2016-05-28 DIAGNOSIS — J9811 Atelectasis: Secondary | ICD-10-CM | POA: Diagnosis not present

## 2016-05-28 DIAGNOSIS — Z9981 Dependence on supplemental oxygen: Secondary | ICD-10-CM | POA: Diagnosis not present

## 2016-05-28 DIAGNOSIS — J9601 Acute respiratory failure with hypoxia: Secondary | ICD-10-CM | POA: Diagnosis not present

## 2016-05-28 DIAGNOSIS — I5023 Acute on chronic systolic (congestive) heart failure: Secondary | ICD-10-CM | POA: Diagnosis not present

## 2016-05-28 DIAGNOSIS — R0602 Shortness of breath: Secondary | ICD-10-CM | POA: Diagnosis not present

## 2016-06-01 DIAGNOSIS — E785 Hyperlipidemia, unspecified: Secondary | ICD-10-CM

## 2016-06-04 DIAGNOSIS — J449 Chronic obstructive pulmonary disease, unspecified: Secondary | ICD-10-CM | POA: Diagnosis not present

## 2016-06-04 DIAGNOSIS — N179 Acute kidney failure, unspecified: Secondary | ICD-10-CM | POA: Diagnosis not present

## 2016-06-04 DIAGNOSIS — Z789 Other specified health status: Secondary | ICD-10-CM | POA: Diagnosis not present

## 2016-06-04 DIAGNOSIS — N189 Chronic kidney disease, unspecified: Secondary | ICD-10-CM | POA: Diagnosis not present

## 2016-06-04 DIAGNOSIS — I509 Heart failure, unspecified: Secondary | ICD-10-CM | POA: Diagnosis not present

## 2016-06-04 DIAGNOSIS — N183 Chronic kidney disease, stage 3 (moderate): Secondary | ICD-10-CM | POA: Diagnosis not present

## 2016-06-09 DIAGNOSIS — Z95810 Presence of automatic (implantable) cardiac defibrillator: Secondary | ICD-10-CM | POA: Diagnosis not present

## 2016-06-10 DIAGNOSIS — I509 Heart failure, unspecified: Secondary | ICD-10-CM | POA: Diagnosis not present

## 2016-06-10 DIAGNOSIS — N184 Chronic kidney disease, stage 4 (severe): Secondary | ICD-10-CM | POA: Diagnosis not present

## 2016-06-12 DIAGNOSIS — I5020 Unspecified systolic (congestive) heart failure: Secondary | ICD-10-CM | POA: Diagnosis not present

## 2016-06-12 DIAGNOSIS — I779 Disorder of arteries and arterioles, unspecified: Secondary | ICD-10-CM | POA: Diagnosis not present

## 2016-06-15 DIAGNOSIS — Z6824 Body mass index (BMI) 24.0-24.9, adult: Secondary | ICD-10-CM | POA: Diagnosis not present

## 2016-06-15 DIAGNOSIS — I5020 Unspecified systolic (congestive) heart failure: Secondary | ICD-10-CM | POA: Diagnosis not present

## 2016-06-16 DIAGNOSIS — I509 Heart failure, unspecified: Secondary | ICD-10-CM | POA: Diagnosis not present

## 2016-06-17 DIAGNOSIS — J302 Other seasonal allergic rhinitis: Secondary | ICD-10-CM | POA: Diagnosis not present

## 2016-06-17 DIAGNOSIS — Z1389 Encounter for screening for other disorder: Secondary | ICD-10-CM | POA: Diagnosis not present

## 2016-06-17 DIAGNOSIS — I509 Heart failure, unspecified: Secondary | ICD-10-CM | POA: Diagnosis not present

## 2016-06-17 DIAGNOSIS — Z6824 Body mass index (BMI) 24.0-24.9, adult: Secondary | ICD-10-CM | POA: Diagnosis not present

## 2016-06-18 DIAGNOSIS — J302 Other seasonal allergic rhinitis: Secondary | ICD-10-CM | POA: Diagnosis not present

## 2016-06-19 DIAGNOSIS — J302 Other seasonal allergic rhinitis: Secondary | ICD-10-CM | POA: Diagnosis not present

## 2016-06-21 DIAGNOSIS — N289 Disorder of kidney and ureter, unspecified: Secondary | ICD-10-CM | POA: Diagnosis not present

## 2016-06-21 DIAGNOSIS — R42 Dizziness and giddiness: Secondary | ICD-10-CM | POA: Diagnosis not present

## 2016-06-21 DIAGNOSIS — E876 Hypokalemia: Secondary | ICD-10-CM | POA: Diagnosis not present

## 2016-06-21 DIAGNOSIS — R748 Abnormal levels of other serum enzymes: Secondary | ICD-10-CM | POA: Diagnosis not present

## 2016-06-21 DIAGNOSIS — L0390 Cellulitis, unspecified: Secondary | ICD-10-CM | POA: Diagnosis not present

## 2016-06-21 DIAGNOSIS — L03116 Cellulitis of left lower limb: Secondary | ICD-10-CM | POA: Diagnosis not present

## 2016-06-21 DIAGNOSIS — B029 Zoster without complications: Secondary | ICD-10-CM | POA: Diagnosis not present

## 2016-06-21 DIAGNOSIS — K219 Gastro-esophageal reflux disease without esophagitis: Secondary | ICD-10-CM | POA: Diagnosis not present

## 2016-06-21 DIAGNOSIS — E860 Dehydration: Secondary | ICD-10-CM | POA: Diagnosis not present

## 2016-06-21 DIAGNOSIS — Z79899 Other long term (current) drug therapy: Secondary | ICD-10-CM | POA: Diagnosis not present

## 2016-06-21 DIAGNOSIS — R110 Nausea: Secondary | ICD-10-CM | POA: Diagnosis not present

## 2016-06-21 DIAGNOSIS — Z951 Presence of aortocoronary bypass graft: Secondary | ICD-10-CM | POA: Diagnosis not present

## 2016-06-21 DIAGNOSIS — N400 Enlarged prostate without lower urinary tract symptoms: Secondary | ICD-10-CM | POA: Diagnosis not present

## 2016-06-21 DIAGNOSIS — N179 Acute kidney failure, unspecified: Secondary | ICD-10-CM | POA: Diagnosis not present

## 2016-06-21 DIAGNOSIS — M1990 Unspecified osteoarthritis, unspecified site: Secondary | ICD-10-CM | POA: Diagnosis not present

## 2016-06-21 DIAGNOSIS — I1310 Hypertensive heart and chronic kidney disease without heart failure, with stage 1 through stage 4 chronic kidney disease, or unspecified chronic kidney disease: Secondary | ICD-10-CM | POA: Diagnosis not present

## 2016-06-21 DIAGNOSIS — N189 Chronic kidney disease, unspecified: Secondary | ICD-10-CM | POA: Diagnosis not present

## 2016-06-21 DIAGNOSIS — Z95810 Presence of automatic (implantable) cardiac defibrillator: Secondary | ICD-10-CM | POA: Diagnosis not present

## 2016-06-21 DIAGNOSIS — I25810 Atherosclerosis of coronary artery bypass graft(s) without angina pectoris: Secondary | ICD-10-CM | POA: Diagnosis not present

## 2016-06-21 DIAGNOSIS — J449 Chronic obstructive pulmonary disease, unspecified: Secondary | ICD-10-CM | POA: Diagnosis not present

## 2016-06-21 DIAGNOSIS — J302 Other seasonal allergic rhinitis: Secondary | ICD-10-CM | POA: Diagnosis not present

## 2016-06-21 DIAGNOSIS — E7800 Pure hypercholesterolemia, unspecified: Secondary | ICD-10-CM | POA: Diagnosis not present

## 2016-06-21 DIAGNOSIS — E871 Hypo-osmolality and hyponatremia: Secondary | ICD-10-CM | POA: Diagnosis not present

## 2016-06-21 DIAGNOSIS — I252 Old myocardial infarction: Secondary | ICD-10-CM | POA: Diagnosis not present

## 2016-06-22 DIAGNOSIS — J302 Other seasonal allergic rhinitis: Secondary | ICD-10-CM | POA: Diagnosis not present

## 2016-06-23 DIAGNOSIS — J302 Other seasonal allergic rhinitis: Secondary | ICD-10-CM | POA: Diagnosis not present

## 2016-06-24 DIAGNOSIS — Z9189 Other specified personal risk factors, not elsewhere classified: Secondary | ICD-10-CM | POA: Diagnosis not present

## 2016-06-24 DIAGNOSIS — J302 Other seasonal allergic rhinitis: Secondary | ICD-10-CM | POA: Diagnosis not present

## 2016-06-24 DIAGNOSIS — E876 Hypokalemia: Secondary | ICD-10-CM | POA: Diagnosis not present

## 2016-06-24 DIAGNOSIS — B029 Zoster without complications: Secondary | ICD-10-CM | POA: Diagnosis not present

## 2016-06-24 DIAGNOSIS — I5020 Unspecified systolic (congestive) heart failure: Secondary | ICD-10-CM | POA: Diagnosis not present

## 2016-06-25 DIAGNOSIS — Z1389 Encounter for screening for other disorder: Secondary | ICD-10-CM | POA: Diagnosis not present

## 2016-06-25 DIAGNOSIS — Z139 Encounter for screening, unspecified: Secondary | ICD-10-CM | POA: Diagnosis not present

## 2016-06-25 DIAGNOSIS — J302 Other seasonal allergic rhinitis: Secondary | ICD-10-CM | POA: Diagnosis not present

## 2016-06-26 DIAGNOSIS — J302 Other seasonal allergic rhinitis: Secondary | ICD-10-CM | POA: Diagnosis not present

## 2016-06-29 DIAGNOSIS — M79609 Pain in unspecified limb: Secondary | ICD-10-CM | POA: Diagnosis not present

## 2016-06-29 DIAGNOSIS — I110 Hypertensive heart disease with heart failure: Secondary | ICD-10-CM | POA: Diagnosis not present

## 2016-06-29 DIAGNOSIS — Z955 Presence of coronary angioplasty implant and graft: Secondary | ICD-10-CM | POA: Diagnosis not present

## 2016-06-29 DIAGNOSIS — I5023 Acute on chronic systolic (congestive) heart failure: Secondary | ICD-10-CM | POA: Diagnosis not present

## 2016-06-29 DIAGNOSIS — D649 Anemia, unspecified: Secondary | ICD-10-CM | POA: Diagnosis not present

## 2016-06-29 DIAGNOSIS — R091 Pleurisy: Secondary | ICD-10-CM | POA: Diagnosis not present

## 2016-06-29 DIAGNOSIS — N184 Chronic kidney disease, stage 4 (severe): Secondary | ICD-10-CM | POA: Diagnosis not present

## 2016-06-29 DIAGNOSIS — Z95810 Presence of automatic (implantable) cardiac defibrillator: Secondary | ICD-10-CM | POA: Diagnosis not present

## 2016-06-29 DIAGNOSIS — I130 Hypertensive heart and chronic kidney disease with heart failure and stage 1 through stage 4 chronic kidney disease, or unspecified chronic kidney disease: Secondary | ICD-10-CM | POA: Diagnosis not present

## 2016-06-29 DIAGNOSIS — I429 Cardiomyopathy, unspecified: Secondary | ICD-10-CM | POA: Diagnosis not present

## 2016-06-29 DIAGNOSIS — I129 Hypertensive chronic kidney disease with stage 1 through stage 4 chronic kidney disease, or unspecified chronic kidney disease: Secondary | ICD-10-CM | POA: Diagnosis not present

## 2016-06-29 DIAGNOSIS — I10 Essential (primary) hypertension: Secondary | ICD-10-CM | POA: Diagnosis not present

## 2016-06-29 DIAGNOSIS — R079 Chest pain, unspecified: Secondary | ICD-10-CM | POA: Diagnosis not present

## 2016-06-29 DIAGNOSIS — E875 Hyperkalemia: Secondary | ICD-10-CM | POA: Diagnosis not present

## 2016-06-29 DIAGNOSIS — I249 Acute ischemic heart disease, unspecified: Secondary | ICD-10-CM | POA: Diagnosis not present

## 2016-06-29 DIAGNOSIS — I420 Dilated cardiomyopathy: Secondary | ICD-10-CM | POA: Diagnosis not present

## 2016-06-29 DIAGNOSIS — Z951 Presence of aortocoronary bypass graft: Secondary | ICD-10-CM | POA: Diagnosis not present

## 2016-06-29 DIAGNOSIS — I081 Rheumatic disorders of both mitral and tricuspid valves: Secondary | ICD-10-CM | POA: Diagnosis not present

## 2016-06-29 DIAGNOSIS — I2510 Atherosclerotic heart disease of native coronary artery without angina pectoris: Secondary | ICD-10-CM | POA: Diagnosis not present

## 2016-06-29 DIAGNOSIS — I509 Heart failure, unspecified: Secondary | ICD-10-CM | POA: Diagnosis not present

## 2016-06-29 DIAGNOSIS — I739 Peripheral vascular disease, unspecified: Secondary | ICD-10-CM | POA: Diagnosis not present

## 2016-06-29 DIAGNOSIS — I252 Old myocardial infarction: Secondary | ICD-10-CM | POA: Diagnosis not present

## 2016-06-29 DIAGNOSIS — J189 Pneumonia, unspecified organism: Secondary | ICD-10-CM | POA: Diagnosis not present

## 2016-06-29 DIAGNOSIS — J449 Chronic obstructive pulmonary disease, unspecified: Secondary | ICD-10-CM | POA: Diagnosis not present

## 2016-06-29 DIAGNOSIS — I517 Cardiomegaly: Secondary | ICD-10-CM | POA: Diagnosis not present

## 2016-06-29 DIAGNOSIS — J302 Other seasonal allergic rhinitis: Secondary | ICD-10-CM | POA: Diagnosis not present

## 2016-06-29 DIAGNOSIS — N179 Acute kidney failure, unspecified: Secondary | ICD-10-CM | POA: Diagnosis not present

## 2016-06-29 DIAGNOSIS — I219 Acute myocardial infarction, unspecified: Secondary | ICD-10-CM | POA: Diagnosis not present

## 2016-06-29 DIAGNOSIS — J90 Pleural effusion, not elsewhere classified: Secondary | ICD-10-CM | POA: Diagnosis not present

## 2016-06-29 DIAGNOSIS — E8770 Fluid overload, unspecified: Secondary | ICD-10-CM | POA: Diagnosis not present

## 2016-06-30 DIAGNOSIS — J302 Other seasonal allergic rhinitis: Secondary | ICD-10-CM | POA: Diagnosis not present

## 2016-07-07 DIAGNOSIS — E8779 Other fluid overload: Secondary | ICD-10-CM | POA: Diagnosis not present

## 2016-07-07 DIAGNOSIS — E871 Hypo-osmolality and hyponatremia: Secondary | ICD-10-CM | POA: Diagnosis not present

## 2016-07-07 DIAGNOSIS — I129 Hypertensive chronic kidney disease with stage 1 through stage 4 chronic kidney disease, or unspecified chronic kidney disease: Secondary | ICD-10-CM | POA: Diagnosis not present

## 2016-07-07 DIAGNOSIS — I1310 Hypertensive heart and chronic kidney disease without heart failure, with stage 1 through stage 4 chronic kidney disease, or unspecified chronic kidney disease: Secondary | ICD-10-CM | POA: Diagnosis not present

## 2016-07-07 DIAGNOSIS — N184 Chronic kidney disease, stage 4 (severe): Secondary | ICD-10-CM | POA: Diagnosis not present

## 2016-07-13 ENCOUNTER — Ambulatory Visit (INDEPENDENT_AMBULATORY_CARE_PROVIDER_SITE_OTHER): Payer: Medicare Other | Admitting: Cardiology

## 2016-07-13 ENCOUNTER — Encounter: Payer: Self-pay | Admitting: Cardiology

## 2016-07-13 VITALS — BP 126/70 | HR 60 | Resp 18 | Ht 65.5 in | Wt 156.0 lb

## 2016-07-13 DIAGNOSIS — I5042 Chronic combined systolic (congestive) and diastolic (congestive) heart failure: Secondary | ICD-10-CM | POA: Diagnosis not present

## 2016-07-13 DIAGNOSIS — I509 Heart failure, unspecified: Secondary | ICD-10-CM | POA: Diagnosis not present

## 2016-07-13 DIAGNOSIS — I2510 Atherosclerotic heart disease of native coronary artery without angina pectoris: Secondary | ICD-10-CM

## 2016-07-13 DIAGNOSIS — I739 Peripheral vascular disease, unspecified: Secondary | ICD-10-CM

## 2016-07-13 DIAGNOSIS — N184 Chronic kidney disease, stage 4 (severe): Secondary | ICD-10-CM | POA: Diagnosis not present

## 2016-07-13 DIAGNOSIS — I420 Dilated cardiomyopathy: Secondary | ICD-10-CM | POA: Diagnosis not present

## 2016-07-13 MED ORDER — POTASSIUM CHLORIDE ER 10 MEQ PO TBCR: 10.0000 meq | EXTENDED_RELEASE_TABLET | Freq: Every day | ORAL | 0 refills | Status: DC

## 2016-07-13 NOTE — Progress Notes (Signed)
Cardiology Office Note:    Date:  07/13/2016   ID:  Evan Weeks, DOB Aug 07, 1945, MRN [id number]  PCP:  Charlott Rakes, MD  Cardiologist:  Gypsy Balsam, MD    Referring MD: Charlott Rakes, MD   Chief Complaint  Patient presents with  . Hospitalization Follow-up  Posthospitalization, congestive heart failure  History of Present Illness:    Evan Weeks is a 71 y.o. male with a hx of Coronary myopathy with diminished ejection fraction approximately 25%, ICD which is Biotronik device, recent multiple admissions to hospital because of becomes a congestive heart failure, he was also find to have significant kidney dysfunction. He was referred back to me because of decompensated congestive heart failure. He again about 1-1/2 pounds since he's been here last time. Does have significant swelling of lower extremities. Denies having the proximal lateral dyspnea and denies having a problem with breathing. No chest pain tightness squeezing pressure. Chest no dizziness no passing out discharges from the fibula. Af  Past Medical History:  Diagnosis Date  . CAD (coronary artery disease)   . COPD (chronic obstructive pulmonary disease) (HCC)   . Emphysema of lung (HCC)   . HLD (hyperlipidemia)   . HTN (hypertension)   . Ischemic cardiomyopathy   . PAD (peripheral artery disease) (HCC)     Past Surgical History:  Procedure Laterality Date  . BACK SURGERY    . SPINE SURGERY      Current Medications: Current Meds  Medication Sig  . albuterol (ACCUNEB) 1.25 MG/3ML nebulizer solution Take 1 ampule by nebulization every 6 (six) hours as needed for wheezing.  Marland Kitchen aspirin 81 MG tablet Take 81 mg by mouth daily.    . fluticasone furoate-vilanterol (BREO ELLIPTA) 200-25 MCG/INH AEPB Inhale 1 puff into the lungs daily.  . furosemide (LASIX) 40 MG tablet Take 40 mg by mouth 2 (two) times daily.  Marland Kitchen gabapentin (NEURONTIN) 400 MG capsule Take 400 mg by mouth 3 (three) times daily.  .  hydrALAZINE (APRESOLINE) 25 MG tablet Take 25 mg by mouth 3 (three) times daily.  . Ipratropium-Albuterol (COMBIVENT RESPIMAT) 20-100 MCG/ACT AERS respimat Inhale 1 puff into the lungs 4 (four) times daily.  . isosorbide dinitrate (ISORDIL) 10 MG tablet Take 10 mg by mouth 3 (three) times daily.  . metoprolol succinate (TOPROL-XL) 50 MG 24 hr tablet Take 50 mg by mouth 2 (two) times daily. Take with or immediately following a meal.  . nitroGLYCERIN (NITROSTAT) 0.4 MG SL tablet Place 0.4 mg under the tongue every 5 (five) minutes as needed for chest pain.  . ranolazine (RANEXA) 500 MG 12 hr tablet Take 500 mg by mouth 2 (two) times daily.  . rosuvastatin (CRESTOR) 20 MG tablet Take 20 mg by mouth daily.     Allergies:   Patient has no known allergies.   Social History   Social History  . Marital status: Married    Spouse name: N/A  . Number of children: 4  . Years of education: N/A   Social History Main Topics  . Smoking status: Current Every Day Smoker    Packs/day: 1.00    Years: 54.00  . Smokeless tobacco: Current User    Types: Chew     Comment: started when he was 10  . Alcohol use No  . Drug use: No  . Sexual activity: Not Asked   Other Topics Concern  . None   Social History Narrative  . None     Family History: The patient's  family history includes Asthma in his father; Drug abuse in his paternal grandfather; Emphysema in his father; Stroke in his mother. ROS:   Please see the history of present illness.     All other systems reviewed and are negative.  EKGs/Labs/Other Studies Reviewed:      Recent Labs: No results found for requested labs within last 8760 hours.  Recent Lipid Panel No results found for: CHOL, TRIG, HDL, CHOLHDL, VLDL, LDLCALC, LDLDIRECT  Physical Exam:    VS:  BP 126/70   Pulse 60   Resp 18   Ht 5' 5.5" (1.664 m)   Wt 156 lb (70.8 kg)   BMI 25.56 kg/m     Wt Readings from Last 3 Encounters:  07/13/16 156 lb (70.8 kg)  08/06/15  145 lb (65.8 kg)  07/03/15 132 lb (59.9 kg)     GEN:  Well nourished, well developed in no acute distress HEENT: Normal NECK: JVD large even when he's sitting upright, arotid bruits LYMPHATICS: No lymphadenopathy CARDIAC: RRR, no murmurs, rubs, gallops RESPIRATORY:  Clear to auscultation without rales, wheezing or rhonchi  ABDOMEN: Soft, non-tender, non-distended MUSCULOSKELETAL:  No edema; No deformity  SKIN: Warm and dry NEUROLOGIC:  Alert and oriented x 3 PSYCHIATRIC:  Normal affect  Lex: 2+ swelling  ASSESSMENT:    1. Dilated cardiomyopathy (HCC)   2. CAD in native artery   3. Peripheral vascular disease (HCC)   4. Chronic combined systolic and diastolic congestive heart failure (HCC)    PLAN:    1. In order of problems listed above:Decompensated congestive heart failure, mild decompensation, multifactorial does have systolic dysfunction, no significant COPD as well. Of the kidney dysfunction. (GFR is significantly reduced 25 last time. I will ask him to increase temporarily dose of furosemide to 80 morning 40 in the afternoon we cannot do this for 2-3 days, I asked him to weight himself every single day. I will see him back in my office in about 10 days to he's does. 2. Chronic kidney failure, followed by a nephrologist as well as the medicine team. I will temporarily increased dose of furosemide with     details as described above, next time  he'll be here we'll recheck his kidney function. 3. ICD: This is a Biotronik device apparently normal function.  Medication Adjustments/Labs and Tests Ordered: Current medicines are reviewed at length with the patient today.  Concerns regarding medicines are outlined above.  No orders of the defined types were placed in this encounter.  No orders of the defined types were placed in this encounter.   Signed, Gypsy Balsam, MD  07/13/2016 3:57 PM    [facility]

## 2016-07-13 NOTE — Patient Instructions (Signed)
Medication Instructions:  Your physician has recommended you make the following change in your medication: I will ask him to increase temporarily dose of furosemide to 80 morning 40 in the afternoon we cannot do this for 2-3 days, I asked him to weight himself every single day. Please take 10 meq of potassium for 2 days only. The rx has been sent to your pharmacy.    Labwork: When you return to the office.  Testing/Procedures: None   Follow-Up: Your physician recommends that you schedule a follow-up appointment in: 10 days   Any Other Special Instructions Will Be Listed Below (If Applicable).     If you need a refill on your cardiac medications before your next appointment, please call your pharmacy.

## 2016-07-16 DIAGNOSIS — I509 Heart failure, unspecified: Secondary | ICD-10-CM | POA: Diagnosis not present

## 2016-07-25 DIAGNOSIS — Z7951 Long term (current) use of inhaled steroids: Secondary | ICD-10-CM | POA: Diagnosis not present

## 2016-07-25 DIAGNOSIS — Z9981 Dependence on supplemental oxygen: Secondary | ICD-10-CM | POA: Diagnosis not present

## 2016-07-25 DIAGNOSIS — Z7982 Long term (current) use of aspirin: Secondary | ICD-10-CM | POA: Diagnosis not present

## 2016-07-25 DIAGNOSIS — E8770 Fluid overload, unspecified: Secondary | ICD-10-CM | POA: Diagnosis not present

## 2016-07-25 DIAGNOSIS — Z95810 Presence of automatic (implantable) cardiac defibrillator: Secondary | ICD-10-CM | POA: Diagnosis not present

## 2016-07-25 DIAGNOSIS — J90 Pleural effusion, not elsewhere classified: Secondary | ICD-10-CM | POA: Diagnosis not present

## 2016-07-25 DIAGNOSIS — I5023 Acute on chronic systolic (congestive) heart failure: Secondary | ICD-10-CM | POA: Diagnosis not present

## 2016-07-25 DIAGNOSIS — I420 Dilated cardiomyopathy: Secondary | ICD-10-CM | POA: Diagnosis not present

## 2016-07-25 DIAGNOSIS — I130 Hypertensive heart and chronic kidney disease with heart failure and stage 1 through stage 4 chronic kidney disease, or unspecified chronic kidney disease: Secondary | ICD-10-CM | POA: Diagnosis not present

## 2016-07-25 DIAGNOSIS — D631 Anemia in chronic kidney disease: Secondary | ICD-10-CM | POA: Diagnosis not present

## 2016-07-25 DIAGNOSIS — J918 Pleural effusion in other conditions classified elsewhere: Secondary | ICD-10-CM | POA: Diagnosis not present

## 2016-07-25 DIAGNOSIS — M1990 Unspecified osteoarthritis, unspecified site: Secondary | ICD-10-CM | POA: Diagnosis not present

## 2016-07-25 DIAGNOSIS — E1142 Type 2 diabetes mellitus with diabetic polyneuropathy: Secondary | ICD-10-CM | POA: Diagnosis not present

## 2016-07-25 DIAGNOSIS — N289 Disorder of kidney and ureter, unspecified: Secondary | ICD-10-CM | POA: Diagnosis not present

## 2016-07-25 DIAGNOSIS — Z79899 Other long term (current) drug therapy: Secondary | ICD-10-CM | POA: Diagnosis not present

## 2016-07-25 DIAGNOSIS — I509 Heart failure, unspecified: Secondary | ICD-10-CM | POA: Diagnosis not present

## 2016-07-25 DIAGNOSIS — J9621 Acute and chronic respiratory failure with hypoxia: Secondary | ICD-10-CM | POA: Diagnosis not present

## 2016-07-25 DIAGNOSIS — J9611 Chronic respiratory failure with hypoxia: Secondary | ICD-10-CM | POA: Diagnosis not present

## 2016-07-25 DIAGNOSIS — I429 Cardiomyopathy, unspecified: Secondary | ICD-10-CM | POA: Diagnosis not present

## 2016-07-25 DIAGNOSIS — Z951 Presence of aortocoronary bypass graft: Secondary | ICD-10-CM | POA: Diagnosis not present

## 2016-07-25 DIAGNOSIS — I129 Hypertensive chronic kidney disease with stage 1 through stage 4 chronic kidney disease, or unspecified chronic kidney disease: Secondary | ICD-10-CM | POA: Diagnosis not present

## 2016-07-25 DIAGNOSIS — I2510 Atherosclerotic heart disease of native coronary artery without angina pectoris: Secondary | ICD-10-CM | POA: Diagnosis not present

## 2016-07-25 DIAGNOSIS — Z955 Presence of coronary angioplasty implant and graft: Secondary | ICD-10-CM | POA: Diagnosis not present

## 2016-07-25 DIAGNOSIS — J9622 Acute and chronic respiratory failure with hypercapnia: Secondary | ICD-10-CM | POA: Diagnosis not present

## 2016-07-25 DIAGNOSIS — I739 Peripheral vascular disease, unspecified: Secondary | ICD-10-CM | POA: Diagnosis not present

## 2016-07-25 DIAGNOSIS — N184 Chronic kidney disease, stage 4 (severe): Secondary | ICD-10-CM | POA: Diagnosis not present

## 2016-07-25 DIAGNOSIS — D649 Anemia, unspecified: Secondary | ICD-10-CM | POA: Diagnosis not present

## 2016-07-25 DIAGNOSIS — I252 Old myocardial infarction: Secondary | ICD-10-CM | POA: Diagnosis not present

## 2016-07-25 DIAGNOSIS — I110 Hypertensive heart disease with heart failure: Secondary | ICD-10-CM | POA: Diagnosis not present

## 2016-07-25 DIAGNOSIS — R0602 Shortness of breath: Secondary | ICD-10-CM | POA: Diagnosis not present

## 2016-07-25 DIAGNOSIS — J441 Chronic obstructive pulmonary disease with (acute) exacerbation: Secondary | ICD-10-CM | POA: Diagnosis not present

## 2016-07-25 DIAGNOSIS — N179 Acute kidney failure, unspecified: Secondary | ICD-10-CM | POA: Diagnosis not present

## 2016-07-31 DIAGNOSIS — R278 Other lack of coordination: Secondary | ICD-10-CM | POA: Diagnosis not present

## 2016-07-31 DIAGNOSIS — J449 Chronic obstructive pulmonary disease, unspecified: Secondary | ICD-10-CM | POA: Diagnosis not present

## 2016-08-02 DIAGNOSIS — I509 Heart failure, unspecified: Secondary | ICD-10-CM | POA: Diagnosis not present

## 2016-08-03 ENCOUNTER — Ambulatory Visit (INDEPENDENT_AMBULATORY_CARE_PROVIDER_SITE_OTHER): Payer: Medicare Other | Admitting: Cardiology

## 2016-08-03 ENCOUNTER — Encounter: Payer: Self-pay | Admitting: Cardiology

## 2016-08-03 VITALS — BP 132/60 | HR 64 | Resp 20 | Ht 65.5 in | Wt 160.0 lb

## 2016-08-03 DIAGNOSIS — I255 Ischemic cardiomyopathy: Secondary | ICD-10-CM | POA: Diagnosis not present

## 2016-08-03 DIAGNOSIS — I5042 Chronic combined systolic (congestive) and diastolic (congestive) heart failure: Secondary | ICD-10-CM

## 2016-08-03 DIAGNOSIS — Z95810 Presence of automatic (implantable) cardiac defibrillator: Secondary | ICD-10-CM | POA: Diagnosis not present

## 2016-08-03 DIAGNOSIS — I2510 Atherosclerotic heart disease of native coronary artery without angina pectoris: Secondary | ICD-10-CM | POA: Diagnosis not present

## 2016-08-03 DIAGNOSIS — I10 Essential (primary) hypertension: Secondary | ICD-10-CM

## 2016-08-03 NOTE — Progress Notes (Signed)
Cardiology Office Note:    Date:  08/03/2016   ID:  AHSAN SALOM, DOB October 22, 1945, MRN [id number]  PCP:  Charlott Rakes, MD  Cardiologist:  Gypsy Balsam, MD    Referring MD: Charlott Rakes, MD   Chief Complaint  Patient presents with  . 10 day follow up  . Hospitalization Follow-up  Posthospital office visit Chief complaint is I'm short of breath as usual  History of Present Illness:    Evan Weeks is a 71 y.o. male  with the cardiomyopathy as well as COPD. Recently hospitalized because of the conversation. Manage with IV diuretic. However now started having swelling of lower extremities again. Denies having any chest pain tightness squeezing pressure burning chest. No proximal lateral dyspnea.  Past Medical History:  Diagnosis Date  . CAD (coronary artery disease)   . COPD (chronic obstructive pulmonary disease) (HCC)   . Emphysema of lung (HCC)   . HLD (hyperlipidemia)   . HTN (hypertension)   . Ischemic cardiomyopathy   . PAD (peripheral artery disease) (HCC)     Past Surgical History:  Procedure Laterality Date  . BACK SURGERY    . SPINE SURGERY      Current Medications: No outpatient prescriptions have been marked as taking for the 08/03/16 encounter (Office Visit) with Georgeanna Lea, MD.     Allergies:   Patient has no known allergies.   Social History   Social History  . Marital status: Married    Spouse name: N/A  . Number of children: 4  . Years of education: N/A   Social History Main Topics  . Smoking status: Current Every Day Smoker    Packs/day: 1.00    Years: 54.00  . Smokeless tobacco: Current User    Types: Chew     Comment: started when he was 10  . Alcohol use No  . Drug use: No  . Sexual activity: Not Asked   Other Topics Concern  . None   Social History Narrative  . None     Family History: The patient's family history includes Asthma in his father; Drug abuse in his paternal grandfather; Emphysema in his  father; Stroke in his mother. ROS:   Please see the history of present illness.    All 14 point review of systems negative except as described per history of present illness  EKGs/Labs/Other Studies Reviewed:      Recent Labs: No results found for requested labs within last 8760 hours.  Recent Lipid Panel No results found for: CHOL, TRIG, HDL, CHOLHDL, VLDL, LDLCALC, LDLDIRECT  Physical Exam:    VS:  BP 132/60   Pulse 64   Resp 20   Ht 5' 5.5" (1.664 m)   Wt 160 lb (72.6 kg)   BMI 26.22 kg/m     Wt Readings from Last 3 Encounters:  08/03/16 160 lb (72.6 kg)  07/13/16 156 lb (70.8 kg)  08/06/15 145 lb (65.8 kg)     GEN:  Well nourished, well developed in no acute distress HEENT: Normal NECK: No JVD; No carotid bruits LYMPHATICS: No lymphadenopathy CARDIAC: RRR, no murmurs, no rubs, no gallops RESPIRATORY:  Clear to auscultation without rales, wheezing or rhonchi  ABDOMEN: Soft, non-tender, non-distended MUSCULOSKELETAL:  No edema; No deformity  SKIN: Warm and dry LOWER EXTREMITIES: no swelling NEUROLOGIC:  Alert and oriented x 3 PSYCHIATRIC:  Normal affect   ASSESSMENT:    1. Ischemic cardiomyopathy   2. CAD in native artery   3. Essential (  primary) hypertension   4. Chronic combined systolic and diastolic congestive heart failure (HCC)   5. Automatic implantable cardioverter-defibrillator in situ    PLAN:    In order of problems listed above:  1. Ischemic heart myopathy: All medications are appropriate I will continue. Will watch kidney function. 2. Congestive heart failure: Decompensated: We'll check Chem-7, I advised to increased dose of furosemide to 80 twice daily, will check proBNP today. We call him within next 2 days to see how he's doing and I see him back in my office in one week. 3. Essential hypertension: Well-controlled continue present management. 4. ICD: Noted functioned properly.  I will review records for this visit from  hospital.   Medication Adjustments/Labs and Tests Ordered: Current medicines are reviewed at length with the patient today.  Concerns regarding medicines are outlined above.  No orders of the defined types were placed in this encounter.  Medication changes: No orders of the defined types were placed in this encounter.   Signed, Georgeanna Lea, MD, [facility] 08/03/2016 1:42 PM    [facility]

## 2016-08-03 NOTE — Patient Instructions (Addendum)
Medication Instructions:  Dr. Bing Matter would like for you to increase your dose of Lasix to 80 mg twice daily. Please monitor your weight and Blood Pressure. You will receive a call in a few days from the nurse to assess how you are doing.   Labwork: Your physician recommends that you have lab work today: BMP; BNP  Testing/Procedures: None   Follow-Up: Your physician recommends that you schedule a follow-up appointment in: 1 week with Dr. Bing Matter.  Any Other Special Instructions Will Be Listed Below (If Applicable).  Your physician has requested that you regularly monitor and record your blood pressure readings at home. Please use the same machine at the same time of day to check your readings and record them to bring to your follow-up visit.  Your physician recommends that you weigh, daily, at the same time every day, and in the same amount of clothing. Please record your daily weights and bring them with you to your next appointment.   If you need a refill on your cardiac medications before your next appointment, please call your pharmacy.

## 2016-08-04 DIAGNOSIS — I509 Heart failure, unspecified: Secondary | ICD-10-CM | POA: Diagnosis not present

## 2016-08-04 LAB — BASIC METABOLIC PANEL
BUN/Creatinine Ratio: 21 (ref 10–24)
BUN: 47 mg/dL — ABNORMAL HIGH (ref 8–27)
CALCIUM: 8.5 mg/dL — AB (ref 8.6–10.2)
CO2: 27 mmol/L (ref 20–29)
Chloride: 101 mmol/L (ref 96–106)
Creatinine, Ser: 2.21 mg/dL — ABNORMAL HIGH (ref 0.76–1.27)
GFR calc Af Amer: 33 mL/min/{1.73_m2} — ABNORMAL LOW (ref >59)
GFR, EST NON AFRICAN AMERICAN: 29 mL/min/{1.73_m2} — AB (ref >59)
GLUCOSE: 91 mg/dL (ref 65–99)
POTASSIUM: 4.8 mmol/L (ref 3.5–5.2)
Sodium: 141 mmol/L (ref 134–144)

## 2016-08-04 LAB — PRO B NATRIURETIC PEPTIDE: NT-PRO BNP: 32349 pg/mL — AB (ref 0–376)

## 2016-08-05 DIAGNOSIS — R278 Other lack of coordination: Secondary | ICD-10-CM | POA: Diagnosis not present

## 2016-08-05 DIAGNOSIS — J449 Chronic obstructive pulmonary disease, unspecified: Secondary | ICD-10-CM | POA: Diagnosis not present

## 2016-08-06 DIAGNOSIS — R278 Other lack of coordination: Secondary | ICD-10-CM | POA: Diagnosis not present

## 2016-08-06 DIAGNOSIS — J449 Chronic obstructive pulmonary disease, unspecified: Secondary | ICD-10-CM | POA: Diagnosis not present

## 2016-08-10 ENCOUNTER — Emergency Department (HOSPITAL_COMMUNITY): Payer: Medicare Other

## 2016-08-10 ENCOUNTER — Inpatient Hospital Stay (HOSPITAL_COMMUNITY)
Admission: EM | Admit: 2016-08-10 | Discharge: 2016-08-13 | DRG: 291 | Disposition: A | Discharge status: Home Health | Payer: Medicare Other | Attending: Internal Medicine | Admitting: Internal Medicine

## 2016-08-10 ENCOUNTER — Encounter (HOSPITAL_COMMUNITY): Payer: Self-pay | Admitting: Emergency Medicine

## 2016-08-10 DIAGNOSIS — I5043 Acute on chronic combined systolic (congestive) and diastolic (congestive) heart failure: Secondary | ICD-10-CM | POA: Diagnosis present

## 2016-08-10 DIAGNOSIS — F17210 Nicotine dependence, cigarettes, uncomplicated: Secondary | ICD-10-CM | POA: Diagnosis present

## 2016-08-10 DIAGNOSIS — I255 Ischemic cardiomyopathy: Secondary | ICD-10-CM | POA: Diagnosis not present

## 2016-08-10 DIAGNOSIS — J9621 Acute and chronic respiratory failure with hypoxia: Secondary | ICD-10-CM | POA: Diagnosis present

## 2016-08-10 DIAGNOSIS — I5023 Acute on chronic systolic (congestive) heart failure: Secondary | ICD-10-CM

## 2016-08-10 DIAGNOSIS — Z7982 Long term (current) use of aspirin: Secondary | ICD-10-CM | POA: Diagnosis not present

## 2016-08-10 DIAGNOSIS — I447 Left bundle-branch block, unspecified: Secondary | ICD-10-CM | POA: Diagnosis present

## 2016-08-10 DIAGNOSIS — J441 Chronic obstructive pulmonary disease with (acute) exacerbation: Secondary | ICD-10-CM | POA: Diagnosis present

## 2016-08-10 DIAGNOSIS — I10 Essential (primary) hypertension: Secondary | ICD-10-CM

## 2016-08-10 DIAGNOSIS — E785 Hyperlipidemia, unspecified: Secondary | ICD-10-CM | POA: Diagnosis not present

## 2016-08-10 DIAGNOSIS — D649 Anemia, unspecified: Secondary | ICD-10-CM | POA: Diagnosis not present

## 2016-08-10 DIAGNOSIS — N184 Chronic kidney disease, stage 4 (severe): Secondary | ICD-10-CM | POA: Diagnosis present

## 2016-08-10 DIAGNOSIS — Z79899 Other long term (current) drug therapy: Secondary | ICD-10-CM | POA: Diagnosis not present

## 2016-08-10 DIAGNOSIS — Z95810 Presence of automatic (implantable) cardiac defibrillator: Secondary | ICD-10-CM | POA: Diagnosis not present

## 2016-08-10 DIAGNOSIS — R079 Chest pain, unspecified: Secondary | ICD-10-CM | POA: Diagnosis not present

## 2016-08-10 DIAGNOSIS — D631 Anemia in chronic kidney disease: Secondary | ICD-10-CM | POA: Diagnosis present

## 2016-08-10 DIAGNOSIS — N289 Disorder of kidney and ureter, unspecified: Secondary | ICD-10-CM

## 2016-08-10 DIAGNOSIS — Z951 Presence of aortocoronary bypass graft: Secondary | ICD-10-CM

## 2016-08-10 DIAGNOSIS — Z9981 Dependence on supplemental oxygen: Secondary | ICD-10-CM | POA: Diagnosis not present

## 2016-08-10 DIAGNOSIS — D500 Iron deficiency anemia secondary to blood loss (chronic): Secondary | ICD-10-CM | POA: Diagnosis not present

## 2016-08-10 DIAGNOSIS — I130 Hypertensive heart and chronic kidney disease with heart failure and stage 1 through stage 4 chronic kidney disease, or unspecified chronic kidney disease: Secondary | ICD-10-CM | POA: Diagnosis not present

## 2016-08-10 DIAGNOSIS — I2510 Atherosclerotic heart disease of native coronary artery without angina pectoris: Secondary | ICD-10-CM | POA: Diagnosis present

## 2016-08-10 DIAGNOSIS — J90 Pleural effusion, not elsewhere classified: Secondary | ICD-10-CM | POA: Diagnosis not present

## 2016-08-10 DIAGNOSIS — R609 Edema, unspecified: Secondary | ICD-10-CM | POA: Diagnosis present

## 2016-08-10 DIAGNOSIS — I739 Peripheral vascular disease, unspecified: Secondary | ICD-10-CM | POA: Diagnosis not present

## 2016-08-10 DIAGNOSIS — N170 Acute kidney failure with tubular necrosis: Secondary | ICD-10-CM | POA: Diagnosis not present

## 2016-08-10 DIAGNOSIS — D696 Thrombocytopenia, unspecified: Secondary | ICD-10-CM | POA: Diagnosis present

## 2016-08-10 DIAGNOSIS — D509 Iron deficiency anemia, unspecified: Secondary | ICD-10-CM | POA: Diagnosis not present

## 2016-08-10 DIAGNOSIS — D72829 Elevated white blood cell count, unspecified: Secondary | ICD-10-CM | POA: Diagnosis not present

## 2016-08-10 DIAGNOSIS — Z955 Presence of coronary angioplasty implant and graft: Secondary | ICD-10-CM | POA: Diagnosis not present

## 2016-08-10 DIAGNOSIS — I5042 Chronic combined systolic (congestive) and diastolic (congestive) heart failure: Secondary | ICD-10-CM

## 2016-08-10 DIAGNOSIS — I420 Dilated cardiomyopathy: Secondary | ICD-10-CM | POA: Diagnosis present

## 2016-08-10 DIAGNOSIS — N189 Chronic kidney disease, unspecified: Secondary | ICD-10-CM | POA: Diagnosis not present

## 2016-08-10 DIAGNOSIS — R0602 Shortness of breath: Secondary | ICD-10-CM | POA: Diagnosis not present

## 2016-08-10 LAB — BASIC METABOLIC PANEL
ANION GAP: 8 (ref 5–15)
BUN: 34 mg/dL — AB (ref 6–20)
CO2: 27 mmol/L (ref 22–32)
Calcium: 8.7 mg/dL — ABNORMAL LOW (ref 8.9–10.3)
Chloride: 101 mmol/L (ref 101–111)
Creatinine, Ser: 2.48 mg/dL — ABNORMAL HIGH (ref 0.61–1.24)
GFR calc Af Amer: 28 mL/min — ABNORMAL LOW (ref >60)
GFR, EST NON AFRICAN AMERICAN: 25 mL/min — AB (ref >60)
GLUCOSE: 103 mg/dL — AB (ref 65–99)
Potassium: 4.4 mmol/L (ref 3.5–5.1)
Sodium: 136 mmol/L (ref 135–145)

## 2016-08-10 LAB — DIFFERENTIAL
BASOS PCT: 0 %
Basophils Absolute: 0 10*3/uL (ref 0.0–0.1)
EOS ABS: 0.1 10*3/uL (ref 0.0–0.7)
Eosinophils Relative: 1 %
Lymphocytes Relative: 12 %
Lymphs Abs: 0.8 10*3/uL (ref 0.7–4.0)
MONO ABS: 0.5 10*3/uL (ref 0.1–1.0)
MONOS PCT: 8 %
Neutro Abs: 5.6 10*3/uL (ref 1.7–7.7)
Neutrophils Relative %: 79 %

## 2016-08-10 LAB — CBC
HCT: 31.1 % — ABNORMAL LOW (ref 39.0–52.0)
HEMOGLOBIN: 10.2 g/dL — AB (ref 13.0–17.0)
MCH: 30.8 pg (ref 26.0–34.0)
MCHC: 32.8 g/dL (ref 30.0–36.0)
MCV: 94 fL (ref 78.0–100.0)
Platelets: 103 10*3/uL — ABNORMAL LOW (ref 150–400)
RBC: 3.31 MIL/uL — ABNORMAL LOW (ref 4.22–5.81)
RDW: 16.5 % — AB (ref 11.5–15.5)
WBC: 6.8 10*3/uL (ref 4.0–10.5)

## 2016-08-10 LAB — I-STAT TROPONIN, ED: TROPONIN I, POC: 0.05 ng/mL (ref 0.00–0.08)

## 2016-08-10 LAB — BRAIN NATRIURETIC PEPTIDE

## 2016-08-10 MED ORDER — SODIUM CHLORIDE 0.9 % IV SOLN: 250.0000 mL | INTRAVENOUS | Status: DC | PRN

## 2016-08-10 MED ORDER — ZOLPIDEM TARTRATE 5 MG PO TABS
5.0000 mg | ORAL_TABLET | Freq: Once | ORAL | Status: AC
  Administered 2016-08-10: 5 mg via ORAL
  Filled 2016-08-10: qty 1

## 2016-08-10 MED ORDER — HYDRALAZINE HCL 25 MG PO TABS
25.0000 mg | ORAL_TABLET | Freq: Three times a day (TID) | ORAL | Status: DC
  Administered 2016-08-10: 25 mg via ORAL
  Filled 2016-08-10: qty 1

## 2016-08-10 MED ORDER — ISOSORBIDE MONONITRATE ER 30 MG PO TB24
30.0000 mg | ORAL_TABLET | Freq: Every day | ORAL | Status: DC
  Administered 2016-08-10 – 2016-08-13 (×4): 30 mg via ORAL
  Filled 2016-08-10 (×4): qty 1

## 2016-08-10 MED ORDER — BUDESONIDE 0.25 MG/2ML IN SUSP
0.2500 mg | Freq: Two times a day (BID) | RESPIRATORY_TRACT | Status: DC
  Administered 2016-08-10 – 2016-08-13 (×6): 0.25 mg via RESPIRATORY_TRACT
  Filled 2016-08-10 (×6): qty 2

## 2016-08-10 MED ORDER — IPRATROPIUM-ALBUTEROL 0.5-2.5 (3) MG/3ML IN SOLN
3.0000 mL | Freq: Four times a day (QID) | RESPIRATORY_TRACT | Status: DC
  Administered 2016-08-10 – 2016-08-13 (×13): 3 mL via RESPIRATORY_TRACT
  Filled 2016-08-10 (×13): qty 3

## 2016-08-10 MED ORDER — OXYCODONE-ACETAMINOPHEN 5-325 MG PO TABS: 1.0000 | ORAL_TABLET | ORAL | 0 refills | Status: DC | PRN

## 2016-08-10 MED ORDER — AMOXICILLIN 500 MG PO CAPS: 1000.0000 mg | ORAL_CAPSULE | Freq: Two times a day (BID) | ORAL | 0 refills | Status: DC

## 2016-08-10 MED ORDER — RANOLAZINE ER 500 MG PO TB12
500.0000 mg | ORAL_TABLET | Freq: Two times a day (BID) | ORAL | Status: DC
  Administered 2016-08-10 – 2016-08-13 (×7): 500 mg via ORAL
  Filled 2016-08-10 (×7): qty 1

## 2016-08-10 MED ORDER — FUROSEMIDE 10 MG/ML IJ SOLN
80.0000 mg | Freq: Two times a day (BID) | INTRAMUSCULAR | Status: DC
  Administered 2016-08-10 – 2016-08-13 (×7): 80 mg via INTRAVENOUS
  Filled 2016-08-10 (×7): qty 8

## 2016-08-10 MED ORDER — HYDRALAZINE HCL 50 MG PO TABS
50.0000 mg | ORAL_TABLET | Freq: Three times a day (TID) | ORAL | Status: DC
  Administered 2016-08-10 – 2016-08-13 (×9): 50 mg via ORAL
  Filled 2016-08-10 (×9): qty 1

## 2016-08-10 MED ORDER — LORAZEPAM 1 MG PO TABS
1.0000 mg | ORAL_TABLET | Freq: Every day | ORAL | Status: DC
  Administered 2016-08-10 – 2016-08-12 (×3): 1 mg via ORAL
  Filled 2016-08-10 (×3): qty 1

## 2016-08-10 MED ORDER — ONDANSETRON HCL 4 MG/2ML IJ SOLN: 4.0000 mg | Freq: Four times a day (QID) | INTRAMUSCULAR | Status: DC | PRN

## 2016-08-10 MED ORDER — ACETAMINOPHEN 325 MG PO TABS
650.0000 mg | ORAL_TABLET | ORAL | Status: DC | PRN
Start: 2016-08-10 — End: 2016-08-13
  Administered 2016-08-11: 650 mg via ORAL
  Filled 2016-08-10: qty 2

## 2016-08-10 MED ORDER — ALBUTEROL SULFATE (2.5 MG/3ML) 0.083% IN NEBU: 2.5000 mg | INHALATION_SOLUTION | Freq: Four times a day (QID) | RESPIRATORY_TRACT | Status: DC | PRN

## 2016-08-10 MED ORDER — SODIUM CHLORIDE 0.9% FLUSH: 3.0000 mL | INTRAVENOUS | Status: DC | PRN

## 2016-08-10 MED ORDER — LABETALOL HCL 5 MG/ML IV SOLN: 5.0000 mg | INTRAVENOUS | Status: DC | PRN

## 2016-08-10 MED ORDER — FLUTICASONE FUROATE-VILANTEROL 200-25 MCG/INH IN AEPB
1.0000 | INHALATION_SPRAY | Freq: Every day | RESPIRATORY_TRACT | Status: DC
  Filled 2016-08-10: qty 28

## 2016-08-10 MED ORDER — ALUM & MAG HYDROXIDE-SIMETH 200-200-20 MG/5ML PO SUSP
15.0000 mL | ORAL | Status: DC | PRN
  Administered 2016-08-10 – 2016-08-11 (×2): 15 mL via ORAL
  Filled 2016-08-10 (×2): qty 30

## 2016-08-10 MED ORDER — LABETALOL HCL 5 MG/ML IV SOLN
5.0000 mg | INTRAVENOUS | Status: DC | PRN
  Filled 2016-08-10: qty 4

## 2016-08-10 MED ORDER — METOPROLOL SUCCINATE ER 50 MG PO TB24
50.0000 mg | ORAL_TABLET | Freq: Two times a day (BID) | ORAL | Status: DC
  Administered 2016-08-10 – 2016-08-13 (×7): 50 mg via ORAL
  Filled 2016-08-10 (×7): qty 1

## 2016-08-10 MED ORDER — FUROSEMIDE 10 MG/ML IJ SOLN
80.0000 mg | Freq: Once | INTRAMUSCULAR | Status: AC
Start: 2016-08-10 — End: 2016-08-10
  Administered 2016-08-10: 80 mg via INTRAVENOUS
  Filled 2016-08-10: qty 8

## 2016-08-10 MED ORDER — ASPIRIN EC 81 MG PO TBEC
81.0000 mg | DELAYED_RELEASE_TABLET | Freq: Every day | ORAL | Status: DC
  Administered 2016-08-10 – 2016-08-13 (×4): 81 mg via ORAL
  Filled 2016-08-10 (×4): qty 1

## 2016-08-10 MED ORDER — SODIUM CHLORIDE 0.9% FLUSH
3.0000 mL | Freq: Two times a day (BID) | INTRAVENOUS | Status: DC
  Administered 2016-08-10 – 2016-08-13 (×5): 3 mL via INTRAVENOUS

## 2016-08-10 MED ORDER — METHYLPREDNISOLONE SODIUM SUCC 125 MG IJ SOLR
60.0000 mg | Freq: Three times a day (TID) | INTRAMUSCULAR | Status: DC
  Administered 2016-08-10 – 2016-08-11 (×3): 60 mg via INTRAVENOUS
  Filled 2016-08-10 (×4): qty 2

## 2016-08-10 MED ORDER — ROSUVASTATIN CALCIUM 20 MG PO TABS
20.0000 mg | ORAL_TABLET | Freq: Every day | ORAL | Status: DC
  Administered 2016-08-10 – 2016-08-13 (×4): 20 mg via ORAL
  Filled 2016-08-10 (×4): qty 1

## 2016-08-10 MED ORDER — GABAPENTIN 400 MG PO CAPS
400.0000 mg | ORAL_CAPSULE | Freq: Two times a day (BID) | ORAL | Status: DC
  Administered 2016-08-10 – 2016-08-13 (×7): 400 mg via ORAL
  Filled 2016-08-10 (×7): qty 1

## 2016-08-10 MED ORDER — HEPARIN SODIUM (PORCINE) 5000 UNIT/ML IJ SOLN
5000.0000 [IU] | Freq: Three times a day (TID) | INTRAMUSCULAR | Status: DC
  Administered 2016-08-10 – 2016-08-13 (×9): 5000 [IU] via SUBCUTANEOUS
  Filled 2016-08-10 (×9): qty 1

## 2016-08-10 NOTE — Consult Note (Signed)
Cardiology Consultation:   Patient ID: Evan Weeks; [id number]; [birth year]   Admit date: 08/10/2016 Date of Consult: 08/10/2016  Primary Care Provider: Charlott Rakes, MD Primary Cardiologist: Dr. Bing Matter Primary Electrophysiologist:  None   Patient Profile:   Evan Weeks is a 71 y.o. male with a hx of ischemic cardiomyopathy, CABG x3, CAD of his native vessels with stent placement per Care Everywhere,   ICD hx of EF 30%, COPD, hypertension, hyperlipidemia, PAD, stage III kidney disease, CHF.   The patient is being seen today for the evaluation of CHF exacerbation at the request of Dr. Rito Ehrlich.  History of Present Illness:   Evan Weeks isa 72 year old, per care everywhere he asa hx of CABG x 3 and coronary stents. Pt believes he had CABG in "a long time ago" and a stent placed "also a long time ago". He uses 2 L O2 home oxygen. Most recent EF is 20-25% from June of 2018, ICD in place.  Pt follows with [facility] for cardiology and was admitted 06/29/2016 and 07/25/2016 for acute on chronic combined heart failure. He came to the ER early this morning for shortness of breath, leg swelling and weight gain. His chest pain improved with nitroglycerin. His SOB is exacerbated by laying flat. BNP > 4,500 in the setting of AKI. Neg Troponin. He was given a large dose of IV lasix and admitted to the hospistalist for further management of COPD +/- CHF exacerbation.   Blood pressure (!) 170/79, pulse 65, temperature (!) 97.4 F (36.3 C), temperature source Oral, resp. rate 18, height  (1.6 m), weight 154 lb 4.8 oz (70 kg), SpO2 95 %.   Past Medical History:  Diagnosis Date  . CAD (coronary artery disease)   . COPD (chronic obstructive pulmonary disease) (HCC)   . Emphysema of lung (HCC)   . HLD (hyperlipidemia)   . HTN (hypertension)   . Ischemic cardiomyopathy   . PAD (peripheral artery disease) (HCC)     Past Surgical History:  Procedure Laterality Date  . AICD  pacemaker    . BACK SURGERY    . SPINE SURGERY       Inpatient Medications: Scheduled Meds: . aspirin EC  81 mg Oral Daily  . budesonide (PULMICORT) nebulizer solution  0.25 mg Nebulization BID  . furosemide  80 mg Intravenous BID  . gabapentin  400 mg Oral BID  . heparin  5,000 Units Subcutaneous Q8H  . hydrALAZINE  25 mg Oral TID  . ipratropium-albuterol  3 mL Inhalation QID  . isosorbide mononitrate  30 mg Oral Daily  . LORazepam  1 mg Oral QHS  . methylPREDNISolone (SOLU-MEDROL) injection  60 mg Intravenous Q8H  . metoprolol succinate  50 mg Oral BID  . ranolazine  500 mg Oral BID  . rosuvastatin  20 mg Oral Daily  . sodium chloride flush  3 mL Intravenous Q12H   Continuous Infusions: . sodium chloride     PRN Meds: sodium chloride, acetaminophen, albuterol, labetalol, ondansetron (ZOFRAN) IV, sodium chloride flush  Allergies:   No Known Allergies  Social History:   Social History   Social History  . Marital status: Married    Spouse name: N/A  . Number of children: 4  . Years of education: N/A   Occupational History  . Not on file.   Social History Main Topics  . Smoking status: Current Every Day Smoker    Packs/day: 1.00    Years: 54.00  . Smokeless  tobacco: Current User    Types: Chew     Comment: started when he was 10  . Alcohol use No  . Drug use: No  . Sexual activity: Not on file   Other Topics Concern  . Not on file   Social History Narrative  . No narrative on file      Family History:  The patient's family history includes Asthma in his father; Drug abuse in his paternal grandfather; Emphysema in his father; Stroke in his mother.  ROS:  Please see the history of present illness.  All other ROS reviewed and negative.     Physical Exam/Data:   Vitals:   08/10/16 0815 08/10/16 0830 08/10/16 0935 08/10/16 1100  BP: (!) 176/95 (!) 169/97 (!) 174/106 (!) 170/79  Pulse: 64 (!) 59 65   Resp: 17 (!) 31 18   Temp:   (!) 97.4 F (36.3 C)    TempSrc:   Oral   SpO2: 94% 96% 95%   Weight:   154 lb 4.8 oz (70 kg)   Height:   5\' 3"  (1.6 m)     Intake/Output Summary (Last 24 hours) at 08/10/16 1244 Last data filed at 08/10/16 1242  Gross per 24 hour  Intake              960 ml  Output             2150 ml  Net            -1190 ml   Filed Weights   08/10/16 0640 08/10/16 0935  Weight: 158 lb (71.7 kg) 154 lb 4.8 oz (70 kg)   Body mass index is 27.33 kg/m.  General: Well developed, well nourished, in no acute distress.  Head: Normocephalic, atraumatic, Neck: Negative for carotid bruits. JVD not elevated. Lungs: + wheezing, receiving breathing treatment during my interview, increased effort of breathing Heart: RRR with S1 S2. No murmurs, rubs, or gallops appreciated. Abdomen: Soft, non-tender, non-distended with normoactive bowel sounds. Msk:  Strength and tone appear normal for age, 2+  Pitting edema Extremities: No clubbing or cyanosis. No edema.  Neuro: Alert and oriented X 3. No facial asymmetry. Psych:  Responds to questions appropriately with a normal affect.  EKG:  The EKG was personally reviewed and demonstrates SR, HR 62.   Relevant CV Studies:  Echocardiogram 06/29/2016 Care Everywhere ------------------------------------------------------------------------------ Findings Mitral Valve Structurally normal mitral valve. Mild mitral regurgitation. Aortic Valve The aortic valve appears to be trileaflet. No aortic stenosis. No aortic regurgitation. Tricuspid Valve Tricuspid valve is structurally normal. Mild tricuspid regurgitation by color flow doppler examination. RVSP 25 mm Hg. Pulmonic Valve The pulmonic valve was not well visualized Mild pulmonic regurgitation. No evidence of pulmonic stenosis. Left Atrium Mild Left atrial enlargement. Left Ventricle Severe global LV hypokinesis. Ejection fraction is visually estimated at 20-25% Right Atrium Normal right atrium. Right Ventricle Normal right  ventricle structure and function. Pacer and/or defibrillator wires visualized in right ventricle. Pericardial Effusion No evidence of pericardial effusion. Miscellaneous The aorta is within normal limits. The IVC is dilated  Laboratory Data:  Chemistry Recent Labs Lab 08/03/16 1411 08/10/16 0112  NA 141 136  K 4.8 4.4  CL 101 101  CO2 27 27  GLUCOSE 91 103*  BUN 47* 34*  CREATININE 2.21* 2.48*  CALCIUM 8.5* 8.7*  GFRNONAA 29* 25*  GFRAA 33* 28*  ANIONGAP  --  8    No results for input(s): PROT, ALBUMIN, AST, ALT, ALKPHOS, BILITOT in  the last 168 hours. Hematology Recent Labs Lab 08/10/16 0112  WBC 6.8  RBC 3.31*  HGB 10.2*  HCT 31.1*  MCV 94.0  MCH 30.8  MCHC 32.8  RDW 16.5*  PLT 103*   Cardiac EnzymesNo results for input(s): TROPONINI in the last 168 hours.  Recent Labs Lab 08/10/16 0121  TROPIPOC 0.05    BNP Recent Labs Lab 08/03/16 1358 08/10/16 0158  BNP  --  >4,500.0*  PROBNP 32,349*  --     DDimer No results for input(s): DDIMER in the last 168 hours.  Radiology/Studies:  Dg Chest 2 View  Result Date: 08/10/2016 CLINICAL DATA:  71 year old male with chest pain and shortness of breath. History of CHF. EXAM: CHEST  2 VIEW COMPARISON:  Chest radiograph dated 07/27/2016 FINDINGS: There is a small right and moderate left pleural effusion, increased compared to the prior radiograph. Left lung base opacity likely combination of pleural effusion and atelectasis. Pneumonia is not excluded. There is no pneumothorax. There is stable cardiomegaly. Left pectoral AICD device. No acute osseous pathology. IMPRESSION: Interval increase in bilateral pleural effusions and left lung base opacity compared to the prior study. Electronically Signed   By: Elgie Collard M.D.   On: 08/10/2016 01:56    Assessment and Plan:   1. Acute on chronic combined CHF:  BNP > 4,500 in the setting of AKI. Neg Troponin.  His dry weight is difficult to determine, his lowest weight  noted was 142lb  in September 2017. So far he has received 1 dose of 80 mg IV lasix and net diuresed > 1 liter. Weight on admission 158, today it is 154. I believe our target weight to be be closer to 140.  -- cont to creatinine closely- -- cont daily weights -- strict I/Os  2. History of COPD with possible concomitant COPD exacerbation:  Medicine to manage. Continue breathing treatments.  3. Chronic kidney disease, stage IV: Cautiously use BB because of his COPD.  No ACE inhibitor because of his CKD,  Creatinine currently 2.5  4. History of essential hypertension: Blood pressure has been poorly controlled this admission. Recommend continuing to diurese with 80 mg IV lasix daily, following his creatinine. If it continues to worsen he may need nephrology.  Will increase Hydralazin 50 mg TID  5. Normocytic anemia: IM managing 6. Thrombocytopenia: IM managing  Signed, Dorthula Matas, PA-C  08/10/2016 12:44 PM  The patient was seen, examined and discussed with Marlon Pel, PA-C and I agree with the above.   EDDIE ILACQUA is a 71 y.o. male with a hx of ischemic cardiomyopathy, CABG x3, CAD of his native vessels with stent placement, ICD hx of EF 30%, COPD, hypertension, hyperlipidemia, PAD, stage III kidney disease, CHF. He was admitted with acute on chronic combined systolic and diastolic CHF and acute on chronic COPD, he is an ongoing smoker. Physical exam reveals crackles and both bases and end-expiratory wheezing B/L, Le edema +1 pitting.  His baseline weight is 144 lbs, admitted with 158, today 154.  His home lasix 80 mg po BID was switched to 80 iv BID with negative 1.7 L, I would continue, BNP > 4500.  I would continue with the same lasix 80 mg iv BID, he is hypertensive, increase hydralazine to 50 mg PO TID.   Tobias Alexander, MD

## 2016-08-10 NOTE — H&P (Addendum)
History and Physical    Evan Weeks:[id number] DOB: Feb 10, 1945 DOA: 08/10/2016  PCP: Charlott Rakes, MD  Patient coming from: Home  I have personally briefly reviewed patient's old medical records in [facility]  Chief Complaint: SOB  HPI: Evan Weeks is a 71 y.o. male with medical history significant of dilated cardiomyopathy with EF 20-25% on echo in June of this year.  Patient was admitted in June and again 2 weeks ago to [facility] for acute on chronic combined CHF.  He required IV diuresis each time.  In June they also tapped a large L pleural effusion as well.  His creat has slowly crept up over past couple of months from 2.0 to 2.5 today.  After discharge from hospital, despite being compliant with meds, he began to put on additional fluid weight, have worsening peripheral edema and SOB.  Went to cardiologists office, Dr. Bing Matter, on 7/23.  Dr. Bing Matter tried increasing his lasix to 80mg  PO BID.  Sadly this hasnt helped and his symptoms of CHF have continued to worsen and he presents to ED today.  ED Course: ED work up is consistent with CHF exacerbation on CXR, BNP, exam.  Creat 2.5.  BP running anywhere from 140 to 180 systolic.  Given 80mg  IV lasix x1.   Review of Systems: As per HPI otherwise 10 point review of systems negative.   Past Medical History:  Diagnosis Date  . CAD (coronary artery disease)   . COPD (chronic obstructive pulmonary disease) (HCC)   . Emphysema of lung (HCC)   . HLD (hyperlipidemia)   . HTN (hypertension)   . Ischemic cardiomyopathy   . PAD (peripheral artery disease) (HCC)     Past Surgical History:  Procedure Laterality Date  . BACK SURGERY    . SPINE SURGERY       reports that he has been smoking.  He has a 54.00 pack-year smoking history. His smokeless tobacco use includes Chew. He reports that he does not drink alcohol or use drugs.  No Known Allergies  Family History  Problem Relation Age of Onset  . Drug abuse  Paternal Grandfather   . Stroke Mother   . Asthma Father   . Emphysema Father      Prior to Admission medications   Medication Sig Start Date End Date Taking? Authorizing Provider  albuterol (PROVENTIL) (2.5 MG/3ML) 0.083% nebulizer solution Take 2.5 mg by nebulization every 6 (six) hours as needed for wheezing or shortness of breath.   Yes [provider]  aspirin 81 MG tablet Take 81 mg by mouth daily.     Yes [provider]  fluticasone furoate-vilanterol (BREO ELLIPTA) 200-25 MCG/INH AEPB Inhale 1 puff into the lungs daily.   Yes [provider]  furosemide (LASIX) 40 MG tablet Take 80 mg by mouth 2 (two) times daily.    Yes [provider]  gabapentin (NEURONTIN) 400 MG capsule Take 400 mg by mouth 2 (two) times daily.    Yes [provider]  hydrALAZINE (APRESOLINE) 25 MG tablet Take 25 mg by mouth 3 (three) times daily.   Yes [provider]  Ipratropium-Albuterol (COMBIVENT RESPIMAT) 20-100 MCG/ACT AERS respimat Inhale 1 puff into the lungs 4 (four) times daily.   Yes [provider]  isosorbide mononitrate (IMDUR) 30 MG 24 hr tablet Take 30 mg by mouth daily.   Yes [provider]  LORazepam (ATIVAN) 1 MG tablet Take 1 mg by mouth at bedtime.   Yes  [provider]  metoprolol succinate (TOPROL-XL) 50 MG 24 hr tablet Take 50 mg by mouth 2 (two) times daily. Take with or immediately following a meal.   Yes [provider]  nitroGLYCERIN (NITROSTAT) 0.4 MG SL tablet Place 0.4 mg under the tongue every 5 (five) minutes as needed for chest pain.   Yes [provider]  ranolazine (RANEXA) 500 MG 12 hr tablet Take 500 mg by mouth 2 (two) times daily.   Yes [provider]  rosuvastatin (CRESTOR) 20 MG tablet Take 20 mg by mouth daily.   Yes [provider]  amoxicillin (AMOXIL) 500 MG capsule Take 2 capsules (1,000 mg total) by mouth 2 (two) times daily. 08/10/16   Evan Booze, MD  oxyCODONE-acetaminophen (PERCOCET) 5-325 MG tablet Take 1 tablet by mouth every 4 (four) hours as needed for moderate pain. 08/10/16   Evan Booze, MD    Physical Exam: Vitals:   08/10/16 0050  BP: (!) 154/79  Pulse: 60  Resp: 20  Temp: (!) 97.4 F (36.3 C)  TempSrc: Oral  SpO2: 96%    Constitutional: NAD, calm, comfortable Eyes: PERRL, lids and conjunctivae normal ENMT: Mucous membranes are moist. Posterior pharynx clear of any exudate or lesions.Normal dentition.  Neck: normal, supple, no masses, no thyromegaly Respiratory: B crackles Cardiovascular: Regular rate and rhythm, no murmurs / rubs / gallops. 3+ pitting edema BLE 2+ pedal pulses. No carotid bruits.  Abdomen: no tenderness, no masses palpated. No hepatosplenomegaly. Bowel sounds positive.  Musculoskeletal: no clubbing / cyanosis. No joint deformity upper and lower extremities. Good ROM, no contractures. Normal muscle tone.  Skin: no rashes, lesions, ulcers. No induration Neurologic: CN 2-12 grossly intact. Sensation intact, DTR normal. Strength 5/5 in all 4.  Psychiatric: Normal judgment and insight. Alert and oriented x 3. Normal mood.    Labs on Admission: I have personally reviewed following labs and imaging studies  CBC:  Recent Labs Lab 08/10/16 0112  WBC 6.8  NEUTROABS 5.6  HGB 10.2*  HCT 31.1*  MCV 94.0  PLT 103*   Basic Metabolic Panel:  Recent Labs Lab 08/03/16 1411 08/10/16 0112  NA 141 136  K 4.8 4.4  CL 101 101  CO2 27 27  GLUCOSE 91 103*  BUN 47* 34*  CREATININE 2.21* 2.48*  CALCIUM 8.5* 8.7*   GFR: Estimated Creatinine Clearance: 24.2 mL/min (A) (by C-G formula based on SCr of 2.48 mg/dL (H)). Liver Function Tests: No results for input(s): AST, ALT, ALKPHOS, BILITOT, PROT, ALBUMIN in the last 168 hours. No results for input(s): LIPASE, AMYLASE in the last 168 hours. No results for input(s): AMMONIA in the last 168 hours. Coagulation Profile: No results for input(s):  INR, PROTIME in the last 168 hours. Cardiac Enzymes: No results for input(s): CKTOTAL, CKMB, CKMBINDEX, TROPONINI in the last 168 hours. BNP (last 3 results)  Recent Labs  08/03/16 1358  PROBNP 32,349*   HbA1C: No results for input(s): HGBA1C in the last 72 hours. CBG: No results for input(s): GLUCAP in the last 168 hours. Lipid Profile: No results for input(s): CHOL, HDL, LDLCALC, TRIG, CHOLHDL, LDLDIRECT in the last 72 hours. Thyroid Function Tests: No results for input(s): TSH, T4TOTAL, FREET4, T3FREE, THYROIDAB in the last 72 hours. Anemia Panel: No results for input(s): VITAMINB12, FOLATE, FERRITIN, TIBC, IRON, RETICCTPCT in the last 72 hours. Urine analysis: No results found for: COLORURINE, APPEARANCEUR, LABSPEC, PHURINE, GLUCOSEU, HGBUR, BILIRUBINUR, KETONESUR, PROTEINUR, UROBILINOGEN, NITRITE, LEUKOCYTESUR  Radiological Exams on Admission: Dg Chest 2  View  Result Date: 08/10/2016 CLINICAL DATA:  71 year old male with chest pain and shortness of breath. History of CHF. EXAM: CHEST  2 VIEW COMPARISON:  Chest radiograph dated 07/27/2016 FINDINGS: There is a small right and moderate left pleural effusion, increased compared to the prior radiograph. Left lung base opacity likely combination of pleural effusion and atelectasis. Pneumonia is not excluded. There is no pneumothorax. There is stable cardiomegaly. Left pectoral AICD device. No acute osseous pathology. IMPRESSION: Interval increase in bilateral pleural effusions and left lung base opacity compared to the prior study. Electronically Signed   By: Elgie Collard M.D.   On: 08/10/2016 01:56    EKG: Independently reviewed.  Assessment/Plan Principal Problem:   Acute on chronic combined systolic and diastolic CHF (congestive heart failure) (HCC) Active Problems:   Essential (primary) hypertension   CKD (chronic kidney disease) stage 4, GFR 15-29 ml/min (HCC)    1. Acute on chronic combined CHF - 1. EF 20-25%, AICD in  place, echo from June is available in Care Everywhere on epic 2. CHF pathway 3. Will try starting Lasix 80mg  IV BID for now 4. BMP daily 5. Likely warrants CHF team evaluation for further recs and med adjustment. 6. May or may not need pleuracentesis for the L pleural effusion as well, will defer this decision to CHF team 2. HTN - 1. Continue home BP meds 2. Adding PRN labetalol 3. CKD stage 4 - has been slowly progressive, creat now up to ~2.5  DVT prophylaxis: Heparin [place] Code Status: Full Family Communication: Family at bedside Disposition Plan: Place in obs Consults called: Put message in Epic to CHF team, no consult called Admission status: Admit to inpatient - will need multiple days of IV diuresis and management for severe CHF inpatient.   Hillary Bow DO [facility] Pager [phone]  If 7AM-7PM, please contact day team taking care of patient [email or web address] Password [email or web address]  08/10/2016, 4:21 AM

## 2016-08-10 NOTE — Progress Notes (Addendum)
[facility] PROGRESS NOTE  Evan Weeks ZOX:[id number] DOB: 04/18/1945 DOA: 08/10/2016  PCP: Charlott Rakes, MD  Brief History/Interval Summary: 71 year old Caucasian male with a past medical history of dilated cardiomyopathy with EF of 20-25% based on echocardiogram done in June of this year. Patient was hospitalized to [facility] in June for acute and chronic combined CHF. He was aggressively diuresed and was discharged. Within a few days after discharge, patient started developing lower extremity edema. He went to his cardiologist who increased the dose of his diuretics. Initially it was helping, but then his symptoms worsened and so he decided to come into the emergency department for further evaluation. He was hospitalized for further management of CHF exacerbation.  Reason for Visit: Acute systolic CHF. Possible concomitant COPD exacerbation.  Consultants: Cardiology  Procedures: None  Antibiotics: None  Subjective/Interval History: Patient states that he is feeling better this morning. He was given Lasix in the emergency department and has urinated a lot. Denies any chest pain. Complains of wheezing and a dry cough.  ROS: Denies any nausea or vomiting  Objective:  Vital Signs  Vitals:   08/10/16 0800 08/10/16 0815 08/10/16 0830 08/10/16 0935  BP:  (!) 176/95 (!) 169/97 (!) 174/106  Pulse: 67 64 (!) 59 65  Resp: 20 17 (!) 31 18  Temp:    (!) 97.4 F (36.3 C)  TempSrc:    Oral  SpO2: 91% 94% 96% 95%  Weight:    70 kg (154 lb 4.8 oz)  Height:     (1.6 m)    Intake/Output Summary (Last 24 hours) at 08/10/16 1214 Last data filed at 08/10/16 0900  Gross per 24 hour  Intake                0 ml  Output             1875 ml  Net            -1875 ml   Filed Weights   08/10/16 0640 08/10/16 0935  Weight: 71.7 kg (158 lb) 70 kg (154 lb 4.8 oz)    General appearance: alert, cooperative, appears stated age and no distress Head:  Normocephalic, without obvious abnormality, atraumatic Resp: Noted to be tachypneic with some use of accessory muscles. Wheezing heard bilaterally with crackles at the bases. Dullness to percussion at bases. Cardio: S1, S2 is irregularly irregular. No S3, S4. No rubs. Systolic murmur appreciated over the precordium GI: soft, non-tender; bowel sounds normal; no masses,  no organomegaly Extremities: edema 2-3+ pitting edema bilateral lower extremity Neurologic: Awake and alert. Oriented 3. No focal neurological deficits  Lab Results:  Data Reviewed: I have personally reviewed following labs and imaging studies  CBC:  Recent Labs Lab 08/10/16 0112  WBC 6.8  NEUTROABS 5.6  HGB 10.2*  HCT 31.1*  MCV 94.0  PLT 103*    Basic Metabolic Panel:  Recent Labs Lab 08/03/16 1411 08/10/16 0112  NA 141 136  K 4.8 4.4  CL 101 101  CO2 27 27  GLUCOSE 91 103*  BUN 47* 34*  CREATININE 2.21* 2.48*  CALCIUM 8.5* 8.7*    GFR: Estimated Creatinine Clearance: 24 mL/min (A) (by C-G formula based on SCr of 2.48 mg/dL (H)).    Radiology Studies: Dg Chest 2 View  Result Date: 08/10/2016 CLINICAL DATA:  71 year old male with chest pain and shortness of breath. History of CHF. EXAM: CHEST  2 VIEW COMPARISON:  Chest radiograph dated 07/27/2016  FINDINGS: There is a small right and moderate left pleural effusion, increased compared to the prior radiograph. Left lung base opacity likely combination of pleural effusion and atelectasis. Pneumonia is not excluded. There is no pneumothorax. There is stable cardiomegaly. Left pectoral AICD device. No acute osseous pathology. IMPRESSION: Interval increase in bilateral pleural effusions and left lung base opacity compared to the prior study. Electronically Signed   By: Elgie Collard M.D.   On: 08/10/2016 01:56     Medications:  Scheduled: . aspirin EC  81 mg Oral Daily  . budesonide (PULMICORT) nebulizer solution  0.25 mg Nebulization BID  .  furosemide  80 mg Intravenous BID  . gabapentin  400 mg Oral BID  . heparin  5,000 Units Subcutaneous Q8H  . hydrALAZINE  25 mg Oral TID  . ipratropium-albuterol  3 mL Inhalation QID  . isosorbide mononitrate  30 mg Oral Daily  . LORazepam  1 mg Oral QHS  . methylPREDNISolone (SOLU-MEDROL) injection  60 mg Intravenous Q8H  . metoprolol succinate  50 mg Oral BID  . ranolazine  500 mg Oral BID  . rosuvastatin  20 mg Oral Daily  . sodium chloride flush  3 mL Intravenous Q12H   Continuous: . sodium chloride     VEZ:BMZTAE chloride, acetaminophen, albuterol, labetalol, ondansetron (ZOFRAN) IV, sodium chloride flush  Assessment/Plan:  Principal Problem:   Acute on chronic combined systolic and diastolic CHF (congestive heart failure) (HCC) Active Problems:   Essential (primary) hypertension   CKD (chronic kidney disease) stage 4, GFR 15-29 ml/min (HCC)    Acute on chronic combined CHF. Patient's ejection fraction is noted to be about 20-25%. He has an AICD in situ. Patient was seen by his outpatient cardiologist recently who increased the dose of his Lasix to 80 mg twice a day orally. Despite this patient's symptoms did not improve. He has significant pedal edema. He is currently on high dose Lasix intravenously. Cardiology has been consulted. He is noted to be on beta blocker, though we may have to be cautious because of his wheezing and COPD. No ACE inhibitor due to his chronic kidney disease. He is on hydralazine and nitrates. Strict ins and outs and daily weights. EKG reviewed and shows left bundle branch block and other nonspecific changes which when compared to previous EKG are noted to be old.  History of COPD with possible concomitant COPD exacerbation Patient continues to smoke cigarettes. He has wheezing. There could be an element of COPD exacerbation as well. He will be given nebulizer treatments. Steroids for now. Continue to monitor closely.  Chronic kidney disease, stage  IV Apparently this has been slowly progressing. Creatinine was 2.0 in March and has been around 2.5 since earlier this month. Continue to monitor urine output. It looks like he is followed by a nephrologist at [facility]. Continue to monitor renal function closely.  History of essential hypertension. Monitor blood pressures closely. Blood pressure was elevated in the emergency department. Continue his home medications. Labetalol as needed.  Normocytic anemia. Likely due to chronic kidney disease. Monitor closely. No evidence for overt bleeding.  Thrombocytopenia Low platelet counts noted more recently. Continue to monitor.  DVT Prophylaxis: Subcutaneous heparin    Code Status: Full code  Family Communication: Discussed with the patient and his daughter  Disposition Plan: Management as outlined above.    LOS: 0 days   [facility]  [facility] Pager [phone] 08/10/2016, 12:14 PM  If 7PM-7AM, please contact night-coverage at [email or web address], password [facility]

## 2016-08-10 NOTE — ED Provider Notes (Signed)
[facility]-EMERGENCY DEPT Provider Note   CSN: [id number] Arrival date & time: 08/10/16  6203   By signing my name below, I, Evan Weeks, attest that this documentation has been prepared under the direction and in the presence of Evan Booze, MD. Electronically signed, Evan Weeks, ED Scribe. 08/10/16. 2:53 AM.   History   Chief Complaint Chief Complaint  Patient presents with  . Shortness of Breath   The history is provided by the patient, medical records and a relative. No language interpreter was used.    Evan Weeks is a 71 y.o. male with h/o CAD, emphysema, stage 3 kidney disease, PAD and COPD presenting to the Emergency Department concerning SOB onset 2-3 days ago. Associated leg swelling, weight gain. He also states he experienced chest pain PTA that subsided after he took nitroglycerin. He states laying on his back exacerbates his SOB. Pt followed by [facility] for cardiology. Pt admitted 06/29/2016 and 07/25/2016 for heart failure. Pt on 2L O2 at home, with which he presents in [facility] ED. Upcoming F/U with cardiologist in 2 days. Daughter states pt's leg swelling and weight gain have not been reported to pt's cardiologist. Pt's lasix changed from 40 mg to 80 mg recently. No chest pain or tightness. No other complaints at this time.   Past Medical History:  Diagnosis Date  . CAD (coronary artery disease)   . COPD (chronic obstructive pulmonary disease) (HCC)   . Emphysema of lung (HCC)   . HLD (hyperlipidemia)   . HTN (hypertension)   . Ischemic cardiomyopathy   . PAD (peripheral artery disease) [facility])     Patient Active Problem List   Diagnosis Date Noted  . Congestive heart failure (CHF) (HCC) 07/13/2016  . Emphysema lung (HCC) 04/03/2015  . Claudication (HCC) 11/12/2014  . Peripheral vascular disease (HCC) 09/25/2014  . High potassium 06/20/2014  . Cardiomyopathy (HCC) 06/13/2014  . CAD in native artery 06/13/2014  . Essential (primary) hypertension 06/13/2014  . Below  normal amount of sodium in the blood 06/13/2014  . Automatic implantable cardioverter-defibrillator in situ 06/13/2014  . Cigarette smoker 06/13/2014  . UNSPECIFIED PERIPHERAL VASCULAR DISEASE 08/13/2009    Past Surgical History:  Procedure Laterality Date  . BACK SURGERY    . SPINE SURGERY         Home Medications    Prior to Admission medications   Medication Sig Start Date End Date Taking? Authorizing Provider  albuterol (ACCUNEB) 1.25 MG/3ML nebulizer solution Take 1 ampule by nebulization every 6 (six) hours as needed for wheezing.    [provider]  aspirin 81 MG tablet Take 81 mg by mouth daily.      [provider]  fluticasone furoate-vilanterol (BREO ELLIPTA) 200-25 MCG/INH AEPB Inhale 1 puff into the lungs daily.    [provider]  furosemide (LASIX) 40 MG tablet Take 40 mg by mouth 2 (two) times daily.    [provider]  gabapentin (NEURONTIN) 400 MG capsule Take 400 mg by mouth 3 (three) times daily.    [provider]  hydrALAZINE (APRESOLINE) 25 MG tablet Take 25 mg by mouth 3 (three) times daily.    [provider]  Ipratropium-Albuterol (COMBIVENT RESPIMAT) 20-100 MCG/ACT AERS respimat Inhale 1 puff into the lungs 4 (four) times daily.    [provider]  isosorbide dinitrate (ISORDIL) 10 MG tablet Take 10 mg by mouth 3 (three) times daily.    [provider]  metoprolol succinate (TOPROL-XL) 50 MG 24 hr tablet Take  50 mg by mouth 2 (two) times daily. Take with or immediately following a meal.    [provider]  nitroGLYCERIN (NITROSTAT) 0.4 MG SL tablet Place 0.4 mg under the tongue every 5 (five) minutes as needed for chest pain.    [provider]  potassium chloride (KLOR-CON 10) 10 MEQ tablet Take 1 tablet (10 mEq total) by mouth daily. Take only 1 tablet daily for 2 days. 07/13/16   Georgeanna Lea, MD  ranolazine (RANEXA) 500 MG 12 hr tablet Take 500 mg by mouth 2  (two) times daily.    [provider]  rosuvastatin (CRESTOR) 20 MG tablet Take 20 mg by mouth daily.    [provider]    Family History Family History  Problem Relation Age of Onset  . Drug abuse Paternal Grandfather   . Stroke Mother   . Asthma Father   . Emphysema Father     Social History Social History  Substance Use Topics  . Smoking status: Current Every Day Smoker    Packs/day: 1.00    Years: 54.00  . Smokeless tobacco: Current User    Types: Chew     Comment: started when he was 10  . Alcohol use No     Allergies   Patient has no known allergies.   Review of Systems Review of Systems  Constitutional: Positive for unexpected weight change. Negative for chills, diaphoresis and fever.  Respiratory: Positive for shortness of breath. Negative for chest tightness.   Cardiovascular: Positive for leg swelling. Negative for chest pain.  Gastrointestinal: Negative for nausea and vomiting.  Skin: Negative for color change and wound.  All other systems reviewed and are negative.    Physical Exam Updated Vital Signs BP (!) 154/79 (BP Location: Left Arm)   Pulse 60   Temp (!) 97.4 F (36.3 C) (Oral)   Resp 20   SpO2 96%   Physical Exam  Constitutional: He is oriented to person, place, and time. He appears well-developed and well-nourished.  Dyspneic. Unable to speak in complete sentences.  HENT:  Head: Normocephalic and atraumatic.  Eyes: Pupils are equal, round, and reactive to light. EOM are normal.  Neck: Normal range of motion. Neck supple. No JVD present.  Cardiovascular: Normal rate, regular rhythm and normal heart sounds.   No murmur heard. Pulmonary/Chest: Effort normal. He has decreased breath sounds. He has no wheezes. He has rales. He exhibits no tenderness.  Decreased lung sounds both bases with few rales on R.  Abdominal: Soft. Bowel sounds are normal. He exhibits no distension and no mass. There is no tenderness.    Musculoskeletal: Normal range of motion. He exhibits edema.  3+ pretibial and 2+ presacral edema.  Lymphadenopathy:    He has no cervical adenopathy.  Neurological: He is alert and oriented to person, place, and time. No cranial nerve deficit. He exhibits normal muscle tone. Coordination normal.  Skin: Skin is warm and dry. No rash noted.  Psychiatric: He has a normal mood and affect. His behavior is normal. Judgment and thought content normal.  Nursing note and vitals reviewed.    ED Treatments / Results  DIAGNOSTIC STUDIES: Oxygen Saturation is 96% on [place], adequate by my interpretation.    COORDINATION OF CARE: 2:48 AM-Discussed next steps with pt. Pt verbalized understanding and is agreeable with the plan. Will order IV medications and refer to cardiologist.   Labs (all labs ordered are listed, but only abnormal results are displayed) Labs Reviewed  BASIC  METABOLIC PANEL - Abnormal; Notable for the following:       Result Value   Glucose, Bld 103 (*)    BUN 34 (*)    Creatinine, Ser 2.48 (*)    Calcium 8.7 (*)    GFR calc non Af Amer 25 (*)    GFR calc Af Amer 28 (*)    All other components within normal limits  CBC - Abnormal; Notable for the following:    RBC 3.31 (*)    Hemoglobin 10.2 (*)    HCT 31.1 (*)    RDW 16.5 (*)    Platelets 103 (*)    All other components within normal limits  BRAIN NATRIURETIC PEPTIDE - Abnormal; Notable for the following:    B Natriuretic Peptide >4,500.0 (*)    All other components within normal limits  DIFFERENTIAL  I-STAT TROPONIN, ED    EKG  EKG Interpretation  Date/Time:  Monday August 10 2016 00:41:35 EDT Ventricular Rate:  62 PR Interval:  142 QRS Duration: 130 QT Interval:  488 QTC Calculation: 495 R Axis:   16 Text Interpretation:  Normal sinus rhythm Left bundle branch block Abnormal ECG No old tracing to compare Confirmed by Evan Weeks ([id number]) on 08/10/2016 1:41:28 AM       Radiology Dg Chest 2 View  Result  Date: 08/10/2016 CLINICAL DATA:  71 year old male with chest pain and shortness of breath. History of CHF. EXAM: CHEST  2 VIEW COMPARISON:  Chest radiograph dated 07/27/2016 FINDINGS: There is a small right and moderate left pleural effusion, increased compared to the prior radiograph. Left lung base opacity likely combination of pleural effusion and atelectasis. Pneumonia is not excluded. There is no pneumothorax. There is stable cardiomegaly. Left pectoral AICD device. No acute osseous pathology. IMPRESSION: Interval increase in bilateral pleural effusions and left lung base opacity compared to the prior study. Electronically Signed   By: Elgie Collard M.D.   On: 08/10/2016 01:56    Procedures Procedures (including critical care time)  Medications Ordered in ED Medications  furosemide (LASIX) injection 80 mg (not administered)     Initial Impression / Assessment and Plan / ED Course  I have reviewed the triage vital signs and the nursing notes.  Pertinent labs & imaging results that were available during my care of the patient were reviewed by me and considered in my medical decision making (see chart for details).  CHF exacerbation. Old records are reviewed confirming two hospitalizations in the last 6 weeks at [facility] - both for CHF exacerbations. Chest x-ray shows progression of pleural effusions. It is concerning that he is edema has not responded to increased dose of furosemide. He may need to be considered for thoracentesis because of pleural effusions and dyspnea. Laboratory workup shows chronic renal insufficiency with slight worsening, chronic anemia which is stable, markedly elevated BNP. Is given furosemide intravenously. Case is discussed with Dr. Julian Reil of triad hospitalists, who agrees to admit the patient.  Final Clinical Impressions(s) / ED Diagnoses   Final diagnoses:  Acute on chronic systolic heart failure (HCC)  Renal insufficiency    Normochromic normocytic anemia  Pleural effusion, bilateral    New Prescriptions New Prescriptions   No medications on file   I personally performed the services described in this documentation, which was scribed in my presence. The recorded information has been reviewed and is accurate.       Evan Booze, MD 08/10/16 [phone]

## 2016-08-10 NOTE — ED Triage Notes (Signed)
Patient arrives with complaint of shortness of breath. History of CHF. States that this exacerbation has been ongoing for a couple days. Bilateral lower extremities with 3+ pitting edema. Patient on home O2 in triage.

## 2016-08-10 NOTE — ED Notes (Signed)
Attempted to call report.  3E still getting report on current patient.  Will notify day RN.

## 2016-08-11 ENCOUNTER — Ambulatory Visit: Payer: Medicare Other | Admitting: Cardiology

## 2016-08-11 DIAGNOSIS — I5023 Acute on chronic systolic (congestive) heart failure: Secondary | ICD-10-CM

## 2016-08-11 LAB — CBC
HCT: 31.9 % — ABNORMAL LOW (ref 39.0–52.0)
HEMOGLOBIN: 10.1 g/dL — AB (ref 13.0–17.0)
MCH: 29.8 pg (ref 26.0–34.0)
MCHC: 31.7 g/dL (ref 30.0–36.0)
MCV: 94.1 fL (ref 78.0–100.0)
Platelets: 126 10*3/uL — ABNORMAL LOW (ref 150–400)
RBC: 3.39 MIL/uL — AB (ref 4.22–5.81)
RDW: 16.6 % — ABNORMAL HIGH (ref 11.5–15.5)
WBC: 10.9 10*3/uL — AB (ref 4.0–10.5)

## 2016-08-11 LAB — BASIC METABOLIC PANEL
ANION GAP: 11 (ref 5–15)
BUN: 34 mg/dL — ABNORMAL HIGH (ref 6–20)
CHLORIDE: 100 mmol/L — AB (ref 101–111)
CO2: 26 mmol/L (ref 22–32)
Calcium: 8.4 mg/dL — ABNORMAL LOW (ref 8.9–10.3)
Creatinine, Ser: 2.24 mg/dL — ABNORMAL HIGH (ref 0.61–1.24)
GFR calc Af Amer: 32 mL/min — ABNORMAL LOW (ref >60)
GFR, EST NON AFRICAN AMERICAN: 28 mL/min — AB (ref >60)
Glucose, Bld: 148 mg/dL — ABNORMAL HIGH (ref 65–99)
POTASSIUM: 3.5 mmol/L (ref 3.5–5.1)
SODIUM: 137 mmol/L (ref 135–145)

## 2016-08-11 MED ORDER — POTASSIUM CHLORIDE CRYS ER 20 MEQ PO TBCR
20.0000 meq | EXTENDED_RELEASE_TABLET | Freq: Once | ORAL | Status: AC
  Administered 2016-08-11: 20 meq via ORAL
  Filled 2016-08-11: qty 1

## 2016-08-11 MED ORDER — METHYLPREDNISOLONE SODIUM SUCC 125 MG IJ SOLR
60.0000 mg | Freq: Two times a day (BID) | INTRAMUSCULAR | Status: DC
  Administered 2016-08-11 – 2016-08-13 (×4): 60 mg via INTRAVENOUS
  Filled 2016-08-11 (×4): qty 2

## 2016-08-11 MED ORDER — METOLAZONE 2.5 MG PO TABS
2.5000 mg | ORAL_TABLET | Freq: Every day | ORAL | Status: DC
  Administered 2016-08-11 – 2016-08-12 (×2): 2.5 mg via ORAL
  Filled 2016-08-11 (×2): qty 1

## 2016-08-11 NOTE — Progress Notes (Addendum)
Progress Note  Patient Name: Evan Weeks Date of Encounter: 08/11/2016  Primary Cardiologist: Dr. Bing Matter  Subjective   Evan Weeks is a 71 y.o. male with a hx of ischemic cardiomyopathy, CABG x3, CAD of his native vessels with stent placement per Care Everywhere,   ICD hx of EF 30%, COPD, hypertension, hyperlipidemia, PAD, stage III kidney disease, CHF.  Diuresing well, - 2.585 net loss. Weight is 151 today. Creatinine is 2.24, improved from yesterday. He is happy, had the best night he has had in a long time.  Blood pressure 128/60, pulse 68, temperature 97.9 F (36.6 C), temperature source Oral, resp. rate 18, height 5\' 3"  (1.6 m), weight 151 lb 12.8 oz (68.9 kg), SpO2 98 %.   Inpatient Medications    Scheduled Meds: . aspirin EC  81 mg Oral Daily  . budesonide (PULMICORT) nebulizer solution  0.25 mg Nebulization BID  . furosemide  80 mg Intravenous BID  . gabapentin  400 mg Oral BID  . heparin  5,000 Units Subcutaneous Q8H  . hydrALAZINE  50 mg Oral TID  . ipratropium-albuterol  3 mL Inhalation QID  . isosorbide mononitrate  30 mg Oral Daily  . LORazepam  1 mg Oral QHS  . methylPREDNISolone (SOLU-MEDROL) injection  60 mg Intravenous Q12H  . metoprolol succinate  50 mg Oral BID  . ranolazine  500 mg Oral BID  . rosuvastatin  20 mg Oral Daily  . sodium chloride flush  3 mL Intravenous Q12H   Continuous Infusions: . sodium chloride     PRN Meds: sodium chloride, acetaminophen, albuterol, alum & mag hydroxide-simeth, labetalol, ondansetron (ZOFRAN) IV, sodium chloride flush   Vital Signs    Vitals:   08/11/16 0010 08/11/16 0552 08/11/16 0749 08/11/16 0751  BP: 112/88 128/60    Pulse: 64 68    Resp: 20 18    Temp: 97.9 F (36.6 C) 97.9 F (36.6 C)    TempSrc: Oral Oral    SpO2: 95% 92% 98% 98%  Weight:  151 lb 12.8 oz (68.9 kg)    Height:        Intake/Output Summary (Last 24 hours) at 08/11/16 0955 Last data filed at 08/11/16 0011  Gross per 24  hour  Intake             1440 ml  Output             2150 ml  Net             -710 ml   Filed Weights   08/10/16 0640 08/10/16 0935 08/11/16 0552  Weight: 158 lb (71.7 kg) 154 lb 4.8 oz (70 kg) 151 lb 12.8 oz (68.9 kg)    Telemetry    Atrial paced rhythm- Personally Reviewed   Physical Exam   General: Well developed, well nourished, in no acute distress.  Head: Normocephalic, atraumatic, Neck: Negative for carotid bruits. JVD not elevated. Lungs: + wheezing, receiving breathing treatment during my interview, increased effort of breathing Heart: RRR with S1 S2. No murmurs, rubs, or gallops appreciated. Abdomen: Soft, non-tender, non-distended with normoactive bowel sounds. Msk:  Strength and tone appear normal for age, 2+  Pitting edema Extremities: No clubbing or cyanosis. No edema.  Neuro: Alert and oriented X 3. No facial asymmetry. Psych:  Responds to questions appropriately with a normal affect   Labs    Chemistry Recent Labs Lab 08/10/16 0112 08/11/16 0324  NA 136 137  K 4.4 3.5  CL 101  100*  CO2 27 26  GLUCOSE 103* 148*  BUN 34* 34*  CREATININE 2.48* 2.24*  CALCIUM 8.7* 8.4*  GFRNONAA 25* 28*  GFRAA 28* 32*  ANIONGAP 8 11     Hematology Recent Labs Lab 08/10/16 0112  WBC 6.8  RBC 3.31*  HGB 10.2*  HCT 31.1*  MCV 94.0  MCH 30.8  MCHC 32.8  RDW 16.5*  PLT 103*    Cardiac EnzymesNo results for input(s): TROPONINI in the last 168 hours.  Recent Labs Lab 08/10/16 0121  TROPIPOC 0.05     BNP Recent Labs Lab 08/10/16 0158  BNP >4,500.0*     DDimer No results for input(s): DDIMER in the last 168 hours.   Radiology    Dg Chest 2 View  Result Date: 08/10/2016 CLINICAL DATA:  71 year old male with chest pain and shortness of breath. History of CHF. EXAM: CHEST  2 VIEW COMPARISON:  Chest radiograph dated 07/27/2016 FINDINGS: There is a small right and moderate left pleural effusion, increased compared to the prior radiograph. Left lung  base opacity likely combination of pleural effusion and atelectasis. Pneumonia is not excluded. There is no pneumothorax. There is stable cardiomegaly. Left pectoral AICD device. No acute osseous pathology. IMPRESSION: Interval increase in bilateral pleural effusions and left lung base opacity compared to the prior study. Electronically Signed   By: Elgie Collard M.D.   On: 08/10/2016 01:56    Cardiac Studies   Echocardiogram 06/29/2016 Care Everywhere ------------------------------------------------------------------------------ Findings Mitral Valve Structurally normal mitral valve. Mild mitral regurgitation. Aortic Valve The aortic valve appears to be trileaflet. No aortic stenosis. No aortic regurgitation. Tricuspid Valve Tricuspid valve is structurally normal. Mild tricuspid regurgitation by color flow doppler examination. RVSP 25 mm Hg. Pulmonic Valve The pulmonic valve was not well visualized Mild pulmonic regurgitation. No evidence of pulmonic stenosis. Left Atrium Mild Left atrial enlargement. Left Ventricle Severe global LV hypokinesis. Ejection fraction is visually estimated at 20-25% Right Atrium Normal right atrium. Right Ventricle Normal right ventricle structure and function. Pacer and/or defibrillator wires visualized in right ventricle. Pericardial Effusion No evidence of pericardial effusion. Miscellaneous The aorta is within normal limits. The IVC is dilated   Patient Profile     Evan Weeks is a 71 y.o. male with a hx of ischemic cardiomyopathy, CABG x3, CAD of his native vessels with stent placement, ICD hx of EF 30%, COPD, hypertension, hyperlipidemia, PAD, stage III kidney disease, CHF. He was admitted with acute on chronic combined systolic and diastolic CHF and acute on chronic COPD, he is an ongoing smoker.   Assessment & Plan   1. Acute on chronic combined CHF: Diuresing well on lasix 80 mg IV BID, creatinine is also responding well.  Continue this dose.  -- weight is 151 today, down from 158 on admission. Goal weight is 144. -- NO change to dose of lasix, pt diuresing well and creatinine is down trending.   2. History of COPD with possible concomitant COPD exacerbation:  Medicine to manage.  Receiving breathing tx and steroids.  3. Chronic kidney disease, stage IV: Cautiously use BB because of his COPD.  No ACE inhibitor because of his CKD, Creatinine improving with diuresis  4. Hx of hypertension: Blood pressure seems to be responding well to the addition of Hydralazine 50 mg PO TID and is better controlled this AM. Will continue to monitor.   5. Normocytic anemia: IM managing 6. Thrombocytopenia: IM managing  Signed, Dorthula Matas, PA-C  08/11/2016, 9:55 AM  The patient was seen, examined and discussed with Marlon Pel, PA-C and I agree with the above.   MAYSIN CARSTENS is a 71 y.o. male with a hx of ischemic cardiomyopathy, CABG x3, CAD of his native vessels with stent placement, ICD hx of EF 30%, COPD, hypertension, hyperlipidemia, PAD, stage III kidney disease, CHF. He was admitted with acute on chronic combined systolic and diastolic CHF and acute on chronic COPD, he is an ongoing smoker. Admitted yesterday with acute on chronic combined CHF, his baseline weight is 144 lbs, admitted with 158, today 151. Still significantly fluid overloaded. I will add metolazone 2.5 mg po daily.  His home lasix 80 mg po BID was switched to 80 iv BID with negative 2.4 L in 48 hours, crea is improving 2.48 -> 2.24,   I would continue same dose of lasix, BNP > 4500. BP has improved with increased hydralazine dose 50 mg PO TID.   Tobias Alexander, MD

## 2016-08-11 NOTE — Evaluation (Signed)
Physical Therapy Evaluation Patient Details Name: Evan Weeks MRN: [id number] DOB: [birth year] Today's Date: 08/11/2016   History of Present Illness  Patient is a 71 yo male admitted 08/10/16 with increasing SOB and LE edema.  Two recent hospitalizations due to CHF exacerbation.    PMH::  CAD, COPC, emphysema, ICM, HTN, HLD, PAD, back surgery  Clinical Impression  Patient presents with problems listed below.  Will benefit from acute PT to maximize functional independence prior to discharge home.  Patient with weakness and unsteady gait.  Recommend f/u HHPT for continued therapy.    Follow Up Recommendations Home health PT;Supervision - Intermittent    Equipment Recommendations  None recommended by PT    Recommendations for Other Services       Precautions / Restrictions Precautions Precautions: Fall Restrictions Weight Bearing Restrictions: No      Mobility  Bed Mobility Overal bed mobility: Modified Independent                Transfers Overall transfer level: Needs assistance Equipment used: None Transfers: Sit to/from Stand Sit to Stand: Supervision         General transfer comment: Supervision for safety.  Ambulation/Gait Ambulation/Gait assistance: Min guard Ambulation Distance (Feet): 260 Feet Assistive device: None Gait Pattern/deviations: Step-through pattern;Decreased stride length;Shuffle;Staggering right;Drifts right/left Gait velocity: decreased Gait velocity interpretation: Below normal speed for age/gender General Gait Details: Patient fairly steady initially.  As patient fatigued, he began staggering, mainly to right.  Was able to self-correct.  Ambulated on 2-3 L O2.  SaO2 at 92% at end of ambulation.  HR at 75.  Instructed patient on pursed-lip breathing during gait.  Stairs            Wheelchair Mobility    Modified Rankin (Stroke Patients Only)       Balance Overall balance assessment: Needs assistance         Standing  balance support: No upper extremity supported Standing balance-Leahy Scale: Good Standing balance comment: Able to shift weight and correct balance after staggering to Rt.  Would require assist for higher level balance activities.                             Pertinent Vitals/Pain Pain Assessment: No/denies pain    Home Living Family/patient expects to be discharged to:: Private residence Living Arrangements: Spouse/significant other Available Help at Discharge: Family;Available PRN/intermittently (Wife works.  Home alone most of the day) Type of Home: Mobile home Home Access: Stairs to enter Entrance Stairs-Rails: Right;Left Entrance Stairs-Number of Steps: 5 Home Layout: One level Home Equipment: Walker - 2 wheels;Walker - 4 wheels;Cane - single point;Shower seat      Prior Function Level of Independence: Independent with assistive device(s);Needs assistance   Gait / Transfers Assistance Needed: Ambulates with rollator  ADL's / Homemaking Assistance Needed: Able to complete bathing and dressing "most of the time".  Wife assists with meals and housekeeping        Hand Dominance        Extremity/Trunk Assessment   Upper Extremity Assessment Upper Extremity Assessment: Overall WFL for tasks assessed    Lower Extremity Assessment Lower Extremity Assessment: Generalized weakness (Edema noted BLE's - worse in feet)       Communication   Communication: No difficulties  Cognition Arousal/Alertness: Awake/alert Behavior During Therapy: WFL for tasks assessed/performed (Pleasant) Overall Cognitive Status: Within Functional Limits for tasks assessed (Had a little trouble with time initially.)  General Comments      Exercises     Assessment/Plan    PT Assessment Patient needs continued PT services  PT Problem List Decreased strength;Decreased balance;Decreased mobility;Cardiopulmonary status limiting  activity       PT Treatment Interventions DME instruction;Gait training;Stair training;Functional mobility training;Therapeutic activities;Therapeutic exercise;Balance training;Patient/family education    PT Goals (Current goals can be found in the Care Plan section)  Acute Rehab PT Goals Patient Stated Goal: To stop getting sick. PT Goal Formulation: With patient Time For Goal Achievement: 08/18/16 Potential to Achieve Goals: Good    Frequency Min 3X/week   Barriers to discharge Decreased caregiver support Wife works    Co-evaluation               AM-PAC PT "6 Clicks" Daily Activity  Outcome Measure Difficulty turning over in bed (including adjusting bedclothes, sheets and blankets)?: None Difficulty moving from lying on back to sitting on the side of the bed? : None Difficulty sitting down on and standing up from a chair with arms (e.g., wheelchair, bedside commode, etc,.)?: None Help needed moving to and from a bed to chair (including a wheelchair)?: A Little Help needed walking in hospital room?: A Little Help needed climbing 3-5 steps with a railing? : A Little 6 Click Score: 21    End of Session Equipment Utilized During Treatment: Gait belt;Oxygen Activity Tolerance: Patient tolerated treatment well;Patient limited by fatigue Patient left: in bed;with call bell/phone within reach (sitting EOB for dinner) Nurse Communication: Mobility status PT Visit Diagnosis: Unsteadiness on feet (R26.81);Muscle weakness (generalized) (M62.81)    Time: 5638-7564 PT Time Calculation (min) (ACUTE ONLY): 42 min   Charges:   PT Evaluation $PT Eval Moderate Complexity: 1 Mod PT Treatments $Gait Training: 23-37 mins   PT G Codes:        Evan Weeks, [facility] Acute Rehab Services Pager [phone]   Evan Weeks 08/11/2016, 6:03 PM

## 2016-08-11 NOTE — Progress Notes (Signed)
[facility] PROGRESS NOTE  Evan Weeks ZOX:[id number] DOB: 08/11/1945 DOA: 08/10/2016  PCP: Charlott Rakes, MD  Brief History/Interval Summary: 71 year old Caucasian male with a past medical history of dilated cardiomyopathy with EF of 20-25% based on echocardiogram done in June of this year. Patient was hospitalized to [facility] in June for acute and chronic combined CHF. He was aggressively diuresed and was discharged. Within a few days after discharge, patient started developing lower extremity edema. He went to his cardiologist who increased the dose of his diuretics. Initially it was helping, but then his symptoms worsened and so he decided to come into the emergency department for further evaluation. He was hospitalized for further management of CHF exacerbation.  Reason for Visit: Acute systolic CHF. Concomitant COPD exacerbation.  Consultants: Cardiology  Procedures: None  Antibiotics: None  Subjective/Interval History: Patient feels much better this morning. States that his breathing has improved. Continues to urinate a lot. Denies any chest pain. Wheezing has improved.   ROS: Denies any nausea or vomiting  Objective:  Vital Signs  Vitals:   08/11/16 0010 08/11/16 0552 08/11/16 0749 08/11/16 0751  BP: 112/88 128/60    Pulse: 64 68    Resp: 20 18    Temp: 97.9 F (36.6 C) 97.9 F (36.6 C)    TempSrc: Oral Oral    SpO2: 95% 92% 98% 98%  Weight:  68.9 kg (151 lb 12.8 oz)    Height:        Intake/Output Summary (Last 24 hours) at 08/11/16 1144 Last data filed at 08/11/16 0011  Gross per 24 hour  Intake             1440 ml  Output             2150 ml  Net             -710 ml   Filed Weights   08/10/16 0640 08/10/16 0935 08/11/16 0552  Weight: 71.7 kg (158 lb) 70 kg (154 lb 4.8 oz) 68.9 kg (151 lb 12.8 oz)    General appearance: Awake, alert. In no distress. Resp: Respiratory effort has improved. Not as tachypneic as  yesterday. Improved air entry bilaterally. Continues to have crackles at the bases, but wheezing appears to have diminished significantly.  Cardio: S1, S2 is irregularly irregular. No S3, S4. No rubs. Systolic murmur appreciated over the precordium.  GI: Abdomen is soft. Nontender. Nondistended. Bowel sounds are present. Extremities: Continues to have 2+ pitting edema in bilateral lower extremities. Neurologic: Awake and alert. Oriented 3. No focal neurological deficits  Lab Results:  Data Reviewed: I have personally reviewed following labs and imaging studies  CBC:  Recent Labs Lab 08/10/16 0112 08/11/16 0927  WBC 6.8 10.9*  NEUTROABS 5.6  --   HGB 10.2* 10.1*  HCT 31.1* 31.9*  MCV 94.0 94.1  PLT 103* 126*    Basic Metabolic Panel:  Recent Labs Lab 08/10/16 0112 08/11/16 0324  NA 136 137  K 4.4 3.5  CL 101 100*  CO2 27 26  GLUCOSE 103* 148*  BUN 34* 34*  CREATININE 2.48* 2.24*  CALCIUM 8.7* 8.4*    GFR: Estimated Creatinine Clearance: 26.4 mL/min (A) (by C-G formula based on SCr of 2.24 mg/dL (H)).    Radiology Studies: Dg Chest 2 View  Result Date: 08/10/2016 CLINICAL DATA:  71 year old male with chest pain and shortness of breath. History of CHF. EXAM: CHEST  2 VIEW COMPARISON:  Chest radiograph dated 07/27/2016  FINDINGS: There is a small right and moderate left pleural effusion, increased compared to the prior radiograph. Left lung base opacity likely combination of pleural effusion and atelectasis. Pneumonia is not excluded. There is no pneumothorax. There is stable cardiomegaly. Left pectoral AICD device. No acute osseous pathology. IMPRESSION: Interval increase in bilateral pleural effusions and left lung base opacity compared to the prior study. Electronically Signed   By: Elgie Collard M.D.   On: 08/10/2016 01:56     Medications:  Scheduled: . aspirin EC  81 mg Oral Daily  . budesonide (PULMICORT) nebulizer solution  0.25 mg Nebulization BID  .  furosemide  80 mg Intravenous BID  . gabapentin  400 mg Oral BID  . heparin  5,000 Units Subcutaneous Q8H  . hydrALAZINE  50 mg Oral TID  . ipratropium-albuterol  3 mL Inhalation QID  . isosorbide mononitrate  30 mg Oral Daily  . LORazepam  1 mg Oral QHS  . methylPREDNISolone (SOLU-MEDROL) injection  60 mg Intravenous Q12H  . metoprolol succinate  50 mg Oral BID  . ranolazine  500 mg Oral BID  . rosuvastatin  20 mg Oral Daily  . sodium chloride flush  3 mL Intravenous Q12H   Continuous: . sodium chloride     WUJ:WJXBJY chloride, acetaminophen, albuterol, alum & mag hydroxide-simeth, labetalol, ondansetron (ZOFRAN) IV, sodium chloride flush  Assessment/Plan:  Principal Problem:   Acute on chronic combined systolic and diastolic CHF (congestive heart failure) (HCC) Active Problems:   Essential (primary) hypertension   CKD (chronic kidney disease) stage 4, GFR 15-29 ml/min (HCC)    Acute on chronic combined CHF. Patient's ejection fraction is noted to be about 20-25%. He has an AICD in situ. Patient was recently seen by his outpatient cardiologist recently who increased the dose of his Lasix to 80 mg twice a day orally. Despite this patient's symptoms did not improve. Patient was noted to have significant pedal edema at admission. He was given high dose Lasix intravenously. He seems to be improving. He is diuresing. Weight is decreasing. Cardiology is following. Continue beta blocker. No ACE inhibitor due to chronic kidney disease. He is on hydralazine and nitrates. Continue strict ins and outs and daily weights. EKG showed left bundle branch block and other nonspecific changes which when compared to previous EKG are noted to be old.  History of COPD with possible concomitant COPD exacerbation Patient continues to smoke cigarettes. Patient was noted to be wheezing at the time of admission. He was given nebulizer treatments, steroids. He has improved this morning. Does not have any  wheezing anymore. Continue steroids for now with quick taper starting tomorrow.   Chronic kidney disease, stage IV Apparently this has been slowly progressing. Creatinine was 2.0 in March and has been around 2.5 since earlier this month. Continue to monitor urine output. It looks like he is followed by a nephrologist at [facility]. Continue to monitor renal function closely, which is stable as of now  History of essential hypertension. Blood pressures were quite elevated in the emergency department. They seem to have improved over the last 12 hours. Continue current management. Labetalol as needed.  Normocytic anemia. Likely due to chronic kidney disease. Monitor closely. No evidence for overt bleeding.  Thrombocytopenia Low platelet counts noted more recently. Stable. No evidence for bleeding. Continue to monitor.  DVT Prophylaxis: Subcutaneous heparin    Code Status: Full code  Family Communication: Discussed with the patient  Disposition Plan: Management as outlined above. Start mobilizing.  LOS: 1 day   [facility]  [facility] Pager [phone] 08/11/2016, 11:44 AM  If 7PM-7AM, please contact night-coverage at [email or web address], password [facility]

## 2016-08-12 DIAGNOSIS — N289 Disorder of kidney and ureter, unspecified: Secondary | ICD-10-CM

## 2016-08-12 DIAGNOSIS — D649 Anemia, unspecified: Secondary | ICD-10-CM

## 2016-08-12 LAB — BASIC METABOLIC PANEL
Anion gap: 9 (ref 5–15)
BUN: 45 mg/dL — ABNORMAL HIGH (ref 6–20)
CALCIUM: 8.5 mg/dL — AB (ref 8.9–10.3)
CHLORIDE: 98 mmol/L — AB (ref 101–111)
CO2: 28 mmol/L (ref 22–32)
CREATININE: 2.63 mg/dL — AB (ref 0.61–1.24)
GFR calc non Af Amer: 23 mL/min — ABNORMAL LOW (ref >60)
GFR, EST AFRICAN AMERICAN: 27 mL/min — AB (ref >60)
Glucose, Bld: 145 mg/dL — ABNORMAL HIGH (ref 65–99)
Potassium: 3.7 mmol/L (ref 3.5–5.1)
SODIUM: 135 mmol/L (ref 135–145)

## 2016-08-12 LAB — CBC
HCT: 30.4 % — ABNORMAL LOW (ref 39.0–52.0)
Hemoglobin: 9.7 g/dL — ABNORMAL LOW (ref 13.0–17.0)
MCH: 29.9 pg (ref 26.0–34.0)
MCHC: 31.9 g/dL (ref 30.0–36.0)
MCV: 93.8 fL (ref 78.0–100.0)
PLATELETS: 119 10*3/uL — AB (ref 150–400)
RBC: 3.24 MIL/uL — AB (ref 4.22–5.81)
RDW: 16.8 % — ABNORMAL HIGH (ref 11.5–15.5)
WBC: 11.5 10*3/uL — AB (ref 4.0–10.5)

## 2016-08-12 LAB — IRON AND TIBC
IRON: 14 ug/dL — AB (ref 45–182)
Saturation Ratios: 3 % — ABNORMAL LOW (ref 17.9–39.5)
TIBC: 472 ug/dL — AB (ref 250–450)
UIBC: 458 ug/dL

## 2016-08-12 LAB — RETICULOCYTES
RBC.: 3.12 MIL/uL — ABNORMAL LOW (ref 4.22–5.81)
Retic Count, Absolute: 109.2 10*3/uL (ref 19.0–186.0)
Retic Ct Pct: 3.5 % — ABNORMAL HIGH (ref 0.4–3.1)

## 2016-08-12 LAB — FOLATE: FOLATE: 13 ng/mL (ref >5.9)

## 2016-08-12 LAB — FERRITIN: Ferritin: 41 ng/mL (ref 24–336)

## 2016-08-12 LAB — VITAMIN B12: VITAMIN B 12: 525 pg/mL (ref 180–914)

## 2016-08-12 NOTE — Progress Notes (Signed)
Progress Note  Patient Name: Evan Weeks Date of Encounter: 08/12/2016  Primary Cardiologist: Dr. Bing Matter  Subjective    Evan Weeks a 71 y.o.malewith a hx of ischemic cardiomyopathy, CABG x3, CAD of his native vessels with stent placement per Care Everywhere, ICD hx of EF 30%, COPD, hypertension, hyperlipidemia, PAD, stage III kidney disease, CHF.   Diuresing well, - 3.965 net loss. Weight is up at 155 today. Creatinine is 2.24 yesterday and today 2.63  Vital signs stable. He slept well and is still feeling much better. Feels like fluid is off of his lungs but his legs are still very swollen.  Blood pressure 125/61, pulse 64, temperature 98.4 F (36.9 C), temperature source Oral, resp. rate 20, height  (1.6 m), weight 155 lb 1.6 oz (70.4 kg), SpO2 98 %.   Inpatient Medications    Scheduled Meds: . aspirin EC  81 mg Oral Daily  . budesonide (PULMICORT) nebulizer solution  0.25 mg Nebulization BID  . furosemide  80 mg Intravenous BID  . gabapentin  400 mg Oral BID  . heparin  5,000 Units Subcutaneous Q8H  . hydrALAZINE  50 mg Oral TID  . ipratropium-albuterol  3 mL Inhalation QID  . isosorbide mononitrate  30 mg Oral Daily  . LORazepam  1 mg Oral QHS  . methylPREDNISolone (SOLU-MEDROL) injection  60 mg Intravenous Q12H  . metolazone  2.5 mg Oral Daily  . metoprolol succinate  50 mg Oral BID  . ranolazine  500 mg Oral BID  . rosuvastatin  20 mg Oral Daily  . sodium chloride flush  3 mL Intravenous Q12H   Continuous Infusions: . sodium chloride     PRN Meds: sodium chloride, acetaminophen, albuterol, alum & mag hydroxide-simeth, labetalol, ondansetron (ZOFRAN) IV, sodium chloride flush   Vital Signs    Vitals:   08/11/16 1926 08/11/16 2100 08/12/16 0500 08/12/16 0816  BP:  (!) 151/59 125/61   Pulse:  70 64   Resp:  18 20   Temp:  97.6 F (36.4 C) 98.4 F (36.9 C)   TempSrc:  Oral Oral   SpO2: 96% 95% 96% 98%  Weight:   155 lb 1.6 oz (70.4  kg)   Height:        Intake/Output Summary (Last 24 hours) at 08/12/16 1048 Last data filed at 08/12/16 1044  Gross per 24 hour  Intake              120 ml  Output             1500 ml  Net            -1380 ml   Filed Weights   08/10/16 0935 08/11/16 0552 08/12/16 0500  Weight: 154 lb 4.8 oz (70 kg) 151 lb 12.8 oz (68.9 kg) 155 lb 1.6 oz (70.4 kg)    Telemetry    Atrial paced rhythm - Personally Reviewed   Physical Exam   GEN: Well nourished, well developed HEENT: normal  Neck: no JVD, carotid bruits, or masses Cardiac: RRR. no murmurs, rubs, or gallops. Intact distal pulses bilaterally. 2 + pitting lower extremity edema Respiratory: clear to auscultation bilaterally, normal work of breathing + wheezing GI: soft, nontender, nondistended, + BS MS: no deformity or atrophy  Skin: warm and dry, no rash Neuro: Alert and Oriented x 3, Strength and sensation are intact Psych:   Full affect  Labs    Chemistry Recent Labs Lab 08/10/16 0112 08/11/16 1610 08/12/16 9604  NA 136 137 135  K 4.4 3.5 3.7  CL 101 100* 98*  CO2 27 26 28   GLUCOSE 103* 148* 145*  BUN 34* 34* 45*  CREATININE 2.48* 2.24* 2.63*  CALCIUM 8.7* 8.4* 8.5*  GFRNONAA 25* 28* 23*  GFRAA 28* 32* 27*  ANIONGAP 8 11 9      Hematology Recent Labs Lab 08/10/16 0112 08/11/16 0927 08/12/16 0239  WBC 6.8 10.9* 11.5*  RBC 3.31* 3.39* 3.24*  HGB 10.2* 10.1* 9.7*  HCT 31.1* 31.9* 30.4*  MCV 94.0 94.1 93.8  MCH 30.8 29.8 29.9  MCHC 32.8 31.7 31.9  RDW 16.5* 16.6* 16.8*  PLT 103* 126* 119*    Cardiac EnzymesNo results for input(s): TROPONINI in the last 168 hours.  Recent Labs Lab 08/10/16 0121  TROPIPOC 0.05     BNP Recent Labs Lab 08/10/16 0158  BNP >4,500.0*     DDimer No results for input(s): DDIMER in the last 168 hours.   Radiology    No results found.  Cardiac Studies   Echocardiogram 06/29/2016 Care  Everywhere ------------------------------------------------------------------------------ Findings Mitral Valve Structurally normal mitral valve. Mild mitral regurgitation. Aortic Valve The aortic valve appears to be trileaflet. No aortic stenosis. No aortic regurgitation. Tricuspid Valve Tricuspid valve is structurally normal. Mild tricuspid regurgitation by color flow doppler examination. RVSP 25 mm Hg. Pulmonic Valve The pulmonic valve was not well visualized Mild pulmonic regurgitation. No evidence of pulmonic stenosis. Left Atrium Mild Left atrial enlargement. Left Ventricle Severe global LV hypokinesis. Ejection fraction is visually estimated at 20-25% Right Atrium Normal right atrium. Right Ventricle Normal right ventricle structure and function. Pacer and/or defibrillator wires visualized in right ventricle. Pericardial Effusion No evidence of pericardial effusion. Miscellaneous The aorta is within normal limits. The IVC is dilated   Patient Profile     Evan Weeks a 71 y.o. malewith a hx of ischemic cardiomyopathy, CABG x3, CAD of his native vessels with stent placement, ICD hx of EF 30%, COPD, hypertension, hyperlipidemia, PAD, stage III kidney disease, CHF. He was admitted with acute on chronic combined systolic and diastolic CHF and acute on chronic COPD, he is an ongoing smoker.  Assessment & Plan     1. Acute on chronic combined CHF: Diuresing well on lasix 80 mg IV BID, creatinine is also responding well. Continue this dose.  -- weight is 155. Goal weight is 145-150 based on last office visit. BNP was greater than 4500 on admission. Clinically he still looks fluid overloaded.  --  He has had a bump in his creatinine, given  80 mg BID of IV lasix yesterday and  Metolazone 2.5 mg po daily. Will need to  decrease lasix or Metolazone dose.  -- Consider compression treatment to help get fluid off of his legs  2. History of COPD with possible  concomitant COPD exacerbation: Medicine to manage.   3. Chronic kidney disease, stage BM:[id number] use BB because of his COPD.  No ACE inhibitor because of his CKD, Creatinine improving with diuresis  4. Hx of hypertension: Blood pressure seems to be responding well to the addition of Hydralazine 50 mg PO TID this admission.   5. Normocytic anemia: IM managing 6. Thrombocytopenia: IM managing  Signed, Dorthula Matas, PA-C  08/12/2016, 10:48 AM    The patient was seen, examined and discussed with Marlon Pel, PA-C and I agree with the above.   Evan Weeks a 71 y.o. malewith a hx of ischemic cardiomyopathy, CABG x3, CAD of his native vessels with  stent placement, ICD hx of EF 30%, COPD, hypertension, hyperlipidemia, PAD, stage III kidney disease, CHF. He was admitted with acute on chronic combined systolic and diastolic CHF and acute on chronic COPD, he is an ongoing smoker. Admitted with acute on chronic combined CHF, his baseline weight is 144 lbs, admitted with 158, today 155, ? Yesterday 151. Still significantly fluid overloaded. I added metolazone 2.5 mg po daily. I will discontinue with increasing Crea 2.2->2.6. We will order TED hose.  BP has improved with increased hydralazine dose 50 mg PO TID.   Tobias Alexander, MD

## 2016-08-12 NOTE — Progress Notes (Signed)
Occupational Therapy Evaluation Patient Details Name: Evan Weeks MRN: [id number] DOB: 08-Feb-1945 Today's Date: 08/12/2016    History of Present Illness Patient is a 71 yo male admitted 08/10/16 with increasing SOB and LE edema.  Two recent hospitalizations due to CHF exacerbation.    PMH::  CAD, COPC, emphysema, ICM, HTN, HLD, PAD, back surgery   Clinical Impression   Pt admitted with above.  He is able to perform ADLs at supervision - mod I level.  He reports he feels he is at or close to his baseline.  Do not feel he needs further OT.  Recommend continued ambulation with nsg/mobility     Follow Up Recommendations  No OT follow up;Supervision - Intermittent    Equipment Recommendations  None recommended by OT    Recommendations for Other Services       Precautions / Restrictions Precautions Precautions: Fall      Mobility Bed Mobility                  Transfers Overall transfer level: Modified independent                    Balance Overall balance assessment: Needs assistance Sitting-balance support: No upper extremity supported Sitting balance-Leahy Scale: Good     Standing balance support: No upper extremity supported Standing balance-Leahy Scale: Good                             ADL either performed or assessed with clinical judgement   ADL Overall ADL's : At baseline                                       General ADL Comments: Pt able to perform at supervision - mod I level with exception of socks      Vision         Perception     Praxis      Pertinent Vitals/Pain Pain Assessment: No/denies pain     Hand Dominance     Extremity/Trunk Assessment Upper Extremity Assessment Upper Extremity Assessment: Overall WFL for tasks assessed   Lower Extremity Assessment Lower Extremity Assessment: Defer to PT evaluation   Cervical / Trunk Assessment Cervical / Trunk Assessment: Normal   Communication  Communication Communication: No difficulties   Cognition Arousal/Alertness: Awake/alert Behavior During Therapy: WFL for tasks assessed/performed Overall Cognitive Status: Within Functional Limits for tasks assessed                                     General Comments  DOE 3/4 on 3L supplemental 02.  Pt performed pursed lip breathing     Exercises     Shoulder Instructions      Home Living Family/patient expects to be discharged to:: Private residence Living Arrangements: Spouse/significant other Available Help at Discharge: Family;Available PRN/intermittently Type of Home: Mobile home Home Access: Stairs to enter Entrance Stairs-Number of Steps: 5 Entrance Stairs-Rails: Right;Left Home Layout: One level     Bathroom Shower/Tub: Chief Strategy Officer: Standard     Home Equipment: Environmental consultant - 2 wheels;Walker - 4 wheels;Cane - single point;Shower seat          Prior Functioning/Environment Level of Independence: Needs assistance  Gait / Transfers Assistance Needed: Ambulates  with rollator ADL's / Homemaking Assistance Needed: Requires assist to don socks, but able to perform other ADLs.  Does not drive.              OT Problem List: Decreased activity tolerance;Cardiopulmonary status limiting activity      OT Treatment/Interventions:      OT Goals(Current goals can be found in the care plan section) Acute Rehab OT Goals Patient Stated Goal: "to get this swelling taken care of" OT Goal Formulation: All assessment and education complete, DC therapy  OT Frequency:     Barriers to D/C:            Co-evaluation              AM-PAC PT "6 Clicks" Daily Activity     Outcome Measure Help from another person eating meals?: None Help from another person taking care of personal grooming?: None Help from another person toileting, which includes using toliet, bedpan, or urinal?: None Help from another person bathing (including washing,  rinsing, drying)?: None Help from another person to put on and taking off regular upper body clothing?: None Help from another person to put on and taking off regular lower body clothing?: None 6 Click Score: 24   End of Session Equipment Utilized During Treatment: Oxygen Nurse Communication: Mobility status  Activity Tolerance: Patient tolerated treatment well Patient left: in chair;with call bell/phone within reach  OT Visit Diagnosis: Unsteadiness on feet (R26.81)                Time: 1410-1443 OT Time Calculation (min): 33 min Charges:  OT General Charges $OT Visit: 1 Procedure OT Evaluation $OT Eval Moderate Complexity: 1 Procedure OT Treatments $Therapeutic Activity: 8-22 mins G-Codes:     [employer], OTR/L [phone]   Mekaylah Klich M 08/12/2016, 5:27 PM

## 2016-08-12 NOTE — Progress Notes (Signed)
PROGRESS NOTE    Evan Weeks  PVX:[id number] DOB: Apr 03, 1945 DOA: 08/10/2016 PCP: Charlott Rakes, MD    Brief Narrative: 71 year old male with prior h/o ischemic cardiomyopathy, CAD s/p PCI, CABG , hypertension, COPD, chronic systolic heart failure with LVEF 30%, stage 4 CKD, CAME IN FOR SOB. He was admitted for management of acute on chronic combined systolic and diastolic heart failure.   Assessment & Plan:   Principal Problem:   Acute on chronic combined systolic and diastolic CHF (congestive heart failure) (HCC) Active Problems:   Essential (primary) hypertension   CKD (chronic kidney disease) stage 4, GFR 15-29 ml/min (HCC)   Acute on chronic systolic heart failure (HCC)   Acute on chronic combined systolic and diastolic heart failure:  Diuresis on IV lasix 80 mg BID, metolazone added and his creatinine slightly worsened, d/c metolazone.  Net fluid balance 4 lit since admission.  Cardiology consulted and on board.  TED hoses for leg edema.  Monitor on IV lasix for more diuresis.    H/o COPD with mild exacerbation;  - wheezing has improved.  - on IV solumedrol.   Hypertension:  Well controlled.   Normocytic anemia.  Last hemoglobin from 2011 was 14. And is currently stable around 10.  Anemia panel in am.    Acute on stage 4 CKD: Probably from metolazone.  Diuresis.  Monitor.  Off nephrotoxins.    Mild thrombocytopenia:  No signs of bleeding.  Monitor intermittently.    Mild leukocytosis possibly from steroids     DVT prophylaxis: (heparin sq) Code Status: (Full) Family Communication: (family at bedside) Disposition Plan: pending further diuresis.    Consultants:   Cardiology.   Procedures:  Echocardiogram. On 6/18  Antimicrobials: none   Subjective: Breathing better. On 2 lit of [place] oxygen.   Objective: Vitals:   08/11/16 2100 08/12/16 0500 08/12/16 0816 08/12/16 1229  BP: (!) 151/59 125/61    Pulse: 70 64    Resp: 18 20      Temp: 97.6 F (36.4 C) 98.4 F (36.9 C)    TempSrc: Oral Oral    SpO2: 95% 96% 98% 93%  Weight:  70.4 kg (155 lb 1.6 oz)    Height:        Intake/Output Summary (Last 24 hours) at 08/12/16 1256 Last data filed at 08/12/16 1044  Gross per 24 hour  Intake              120 ml  Output             1500 ml  Net            -1380 ml   Filed Weights   08/10/16 0935 08/11/16 0552 08/12/16 0500  Weight: 70 kg (154 lb 4.8 oz) 68.9 kg (151 lb 12.8 oz) 70.4 kg (155 lb 1.6 oz)    Examination:  General exam: Appears calm and comfortable  Respiratory system: Clear to auscultation. Respiratory effort normal. Scattered wheezing heard.  Cardiovascular system: S1 & S2 heard, RRR. No JVD, murmurs, rubs, gallops or clicks. Gastrointestinal system: Abdomen is nondistended, soft and nontender. No organomegaly or masses felt. Normal bowel sounds heard. Central nervous system: Alert and oriented. No focal neurological deficits. Extremities: Symmetric 5 x 5 power. 2+ pedal edema.  Skin: No rashes, lesions or ulcers Psychiatry: Judgement and insight appear normal. Mood & affect appropriate.     Data Reviewed: I have personally reviewed following labs and imaging studies  CBC:  Recent Labs Lab 08/10/16  0112 08/11/16 0927 08/12/16 0239  WBC 6.8 10.9* 11.5*  NEUTROABS 5.6  --   --   HGB 10.2* 10.1* 9.7*  HCT 31.1* 31.9* 30.4*  MCV 94.0 94.1 93.8  PLT 103* 126* 119*   Basic Metabolic Panel:  Recent Labs Lab 08/10/16 0112 08/11/16 0324 08/12/16 0239  NA 136 137 135  K 4.4 3.5 3.7  CL 101 100* 98*  CO2 GLUCOSE 103* 148* 145*  BUN 34* 34* 45*  CREATININE 2.48* 2.24* 2.63*  CALCIUM 8.7* 8.4* 8.5*   GFR: Estimated Creatinine Clearance: 22.7 mL/min (A) (by C-G formula based on SCr of 2.63 mg/dL (H)). Liver Function Tests: No results for input(s): AST, ALT, ALKPHOS, BILITOT, PROT, ALBUMIN in the last 168 hours. No results for input(s): LIPASE, AMYLASE in the last 168  hours. No results for input(s): AMMONIA in the last 168 hours. Coagulation Profile: No results for input(s): INR, PROTIME in the last 168 hours. Cardiac Enzymes: No results for input(s): CKTOTAL, CKMB, CKMBINDEX, TROPONINI in the last 168 hours. BNP (last 3 results)  Recent Labs  08/03/16 1358  PROBNP 32,349*   HbA1C: No results for input(s): HGBA1C in the last 72 hours. CBG: No results for input(s): GLUCAP in the last 168 hours. Lipid Profile: No results for input(s): CHOL, HDL, LDLCALC, TRIG, CHOLHDL, LDLDIRECT in the last 72 hours. Thyroid Function Tests: No results for input(s): TSH, T4TOTAL, FREET4, T3FREE, THYROIDAB in the last 72 hours. Anemia Panel: No results for input(s): VITAMINB12, FOLATE, FERRITIN, TIBC, IRON, RETICCTPCT in the last 72 hours. Sepsis Labs: No results for input(s): PROCALCITON, LATICACIDVEN in the last 168 hours.  No results found for this or any previous visit (from the past 240 hour(s)).       Radiology Studies: No results found.      Scheduled Meds: . aspirin EC  81 mg Oral Daily  . budesonide (PULMICORT) nebulizer solution  0.25 mg Nebulization BID  . furosemide  80 mg Intravenous BID  . gabapentin  400 mg Oral BID  . heparin  5,000 Units Subcutaneous Q8H  . hydrALAZINE  50 mg Oral TID  . ipratropium-albuterol  3 mL Inhalation QID  . isosorbide mononitrate  30 mg Oral Daily  . LORazepam  1 mg Oral QHS  . methylPREDNISolone (SOLU-MEDROL) injection  60 mg Intravenous Q12H  . metoprolol succinate  50 mg Oral BID  . ranolazine  500 mg Oral BID  . rosuvastatin  20 mg Oral Daily  . sodium chloride flush  3 mL Intravenous Q12H   Continuous Infusions: . sodium chloride       LOS: 2 days    Time spent: 40 minutes.    Kathlen Mody, MD [facility] Pager [phone]  If 7PM-7AM, please contact night-coverage [email or web address] Password [email or web address] 08/12/2016, 12:56 PM

## 2016-08-13 LAB — BASIC METABOLIC PANEL
Anion gap: 11 (ref 5–15)
BUN: 63 mg/dL — AB (ref 6–20)
CHLORIDE: 94 mmol/L — AB (ref 101–111)
CO2: 28 mmol/L (ref 22–32)
CREATININE: 2.95 mg/dL — AB (ref 0.61–1.24)
Calcium: 8.4 mg/dL — ABNORMAL LOW (ref 8.9–10.3)
GFR calc Af Amer: 23 mL/min — ABNORMAL LOW (ref >60)
GFR calc non Af Amer: 20 mL/min — ABNORMAL LOW (ref >60)
GLUCOSE: 134 mg/dL — AB (ref 65–99)
Potassium: 3.5 mmol/L (ref 3.5–5.1)
SODIUM: 133 mmol/L — AB (ref 135–145)

## 2016-08-13 MED ORDER — FUROSEMIDE 80 MG PO TABS: 80.0000 mg | ORAL_TABLET | Freq: Every day | ORAL | Status: DC

## 2016-08-13 MED ORDER — PREDNISONE 10 MG (21) PO TBPK: ORAL_TABLET | ORAL | 0 refills | Status: AC

## 2016-08-13 MED ORDER — HYDRALAZINE HCL 50 MG PO TABS: 50.0000 mg | ORAL_TABLET | Freq: Three times a day (TID) | ORAL | 0 refills | Status: DC

## 2016-08-13 NOTE — Discharge Summary (Addendum)
Physician Discharge Summary  Evan Weeks VPC:[id number] DOB: 12-Apr-1945 DOA: 08/10/2016  PCP: Charlott Rakes, MD  Admit date: 08/10/2016 Discharge date: 08/13/2016  Admitted From: [facility].  Disposition:  Home.   Recommendations for Outpatient Follow-up:  1. Follow up with PCP in 1-2 weeks 2. Please obtain BMP/CBC in one week 3. Please follow up with cardiology as recommended.   Home Health:yes,   Discharge Condition:stable.  CODE STATUS: full code.  Diet recommendation: Heart Healthy    Brief/Interim Summary: 71 year old male with prior h/o ischemic cardiomyopathy, CAD s/p PCI, CABG , hypertension, COPD, chronic systolic heart failure with LVEF 30%, stage 4 CKD, CAME IN FOR SOB. He was admitted for management of acute on chronic combined systolic and diastolic heart failure.   Discharge Diagnoses:  Principal Problem:   Acute on chronic combined systolic and diastolic CHF (congestive heart failure) (HCC) Active Problems:   Essential (primary) hypertension   CKD (chronic kidney disease) stage 4, GFR 15-29 ml/min (HCC)   Acute on chronic systolic heart failure (HCC)  Acute on chronic combined systolic and diastolic heart failure:  Diuresis on IV lasix 80 mg BID, metolazone added and his creatinine slightly worsened, d/c metolazone.  Net fluid balance 5.4 lit since admission.  Cardiology consulted and on board.  TED hoses for leg edema.   Discharge home on home dose of lasix.  Recommend checking Bmp to make sure creatinine is back to baseline.    H/o COPD with mild exacerbation;  - wheezing has improved.  - started on IV solumedrol, transitioned to prednisone taper on discharge.   Hypertension:  Well controlled.   Normocytic anemia.  Last hemoglobin from 2011 was 14. And is currently stable around 10.  Anemia panel shows low iron, recommended iron supplementation on discharge.    Acute on stage 4 CKD: Probably from metolazone.  Diuresis.  Monitor.  Off  nephrotoxins.  Recommended checking bmp in one week, and cut down the lasix to home dose.    Mild thrombocytopenia:  No signs of bleeding.  Monitor intermittently.    Mild leukocytosis possibly from steroids   Acute on chronic hypoxic respiratory failure from copd exacerbation:  See above . n nasal canula oxygen at 2 lit at home.    Discharge Instructions  Discharge Instructions    (HEART FAILURE PATIENTS) Call MD:  Anytime you have any of the following symptoms: 1) 3 pound weight gain in 24 hours or 5 pounds in 1 week 2) shortness of breath, with or without a dry hacking cough 3) swelling in the hands, feet or stomach 4) if you have to sleep on extra pillows at night in order to breathe.    Complete by:  As directed    Diet - low sodium heart healthy    Complete by:  As directed    Discharge instructions    Complete by:  As directed    Please follow up with your PCP in one week.  Please followup with your nephrologist in less than one week, and get BMP checked to check your renal parameters.   Please follow up with your cardiologist as recommended in 1 to 2 weeks.     Allergies as of 08/13/2016   No Known Allergies     Medication List    STOP taking these medications   potassium chloride 10 MEQ tablet Commonly known as:  KLOR-CON 10     TAKE these medications   albuterol (2.5 MG/3ML) 0.083% nebulizer solution Commonly known as:  PROVENTIL Take 2.5 mg by nebulization every 6 (six) hours as needed for wheezing or shortness of breath.   amoxicillin 500 MG capsule Commonly known as:  AMOXIL Take 2 capsules (1,000 mg total) by mouth 2 (two) times daily.   aspirin 81 MG tablet Take 81 mg by mouth daily.   BREO ELLIPTA 200-25 MCG/INH Aepb Generic drug:  fluticasone furoate-vilanterol Inhale 1 puff into the lungs daily.   COMBIVENT RESPIMAT 20-100 MCG/ACT Aers respimat Generic drug:  Ipratropium-Albuterol Inhale 1 puff into the lungs 4 (four) times daily.    furosemide 40 MG tablet Commonly known as:  LASIX Take 80 mg by mouth 2 (two) times daily.   gabapentin 400 MG capsule Commonly known as:  NEURONTIN Take 400 mg by mouth 2 (two) times daily.   hydrALAZINE 50 MG tablet Commonly known as:  APRESOLINE Take 1 tablet (50 mg total) by mouth 3 (three) times daily. What changed:  medication strength  how much to take   isosorbide mononitrate 30 MG 24 hr tablet Commonly known as:  IMDUR Take 30 mg by mouth daily.   LORazepam 1 MG tablet Commonly known as:  ATIVAN Take 1 mg by mouth at bedtime.   metoprolol succinate 50 MG 24 hr tablet Commonly known as:  TOPROL-XL Take 50 mg by mouth 2 (two) times daily. Take with or immediately following a meal.   nitroGLYCERIN 0.4 MG SL tablet Commonly known as:  NITROSTAT Place 0.4 mg under the tongue every 5 (five) minutes as needed for chest pain.   oxyCODONE-acetaminophen 5-325 MG tablet Commonly known as:  PERCOCET Take 1 tablet by mouth every 4 (four) hours as needed for moderate pain.   predniSONE 10 MG (21) Tbpk tablet Commonly known as:  STERAPRED UNI-PAK 21 TAB Prednisone 60 mg daily for 2 days followed by  Prednisone 40 mg daily for 3 days followed by  Prednisone 20 mg daily for 3 days and stop.   ranolazine 500 MG 12 hr tablet Commonly known as:  RANEXA Take 500 mg by mouth 2 (two) times daily.   rosuvastatin 20 MG tablet Commonly known as:  CRESTOR Take 20 mg by mouth daily.      Follow-up Information    Dr. Bing Matter. Go on 08/21/2016.   Why:  Your appointment is at 11:20am, please arrive 15 minutes early. Contact information: [address] [place], [place], [place], [place]. Schedule an appointment as soon as possible for a visit in 1 week(s).   Specialty:  Family Medicine Why:  please check bmp in one week.  Contact information: [address] [place] [address] [phone]          No Known  Allergies  Consultations:  Cardiology.    Procedures/Studies: Dg Chest 2 View  Result Date: 08/10/2016 CLINICAL DATA:  71 year old male with chest pain and shortness of breath. History of CHF. EXAM: CHEST  2 VIEW COMPARISON:  Chest radiograph dated 07/27/2016 FINDINGS: There is a small right and moderate left pleural effusion, increased compared to the prior radiograph. Left lung base opacity likely combination of pleural effusion and atelectasis. Pneumonia is not excluded. There is no pneumothorax. There is stable cardiomegaly. Left pectoral AICD device. No acute osseous pathology. IMPRESSION: Interval increase in bilateral pleural effusions and left lung base opacity compared to the prior study. Electronically Signed   By: Elgie Collard M.D.   On: 08/10/2016 01:56     Subjective:  Wants to go home , no chest pain or sob.  Discharge Exam: Vitals:   08/12/16 2016 08/13/16 0533  BP: 137/69 117/61  Pulse: 69 64  Resp: 20 20  Temp: (!) 97.4 F (36.3 C) 97.9 F (36.6 C)   Vitals:   08/12/16 2016 08/13/16 0533 08/13/16 0821 08/13/16 0822  BP: 137/69 117/61    Pulse: 69 64    Resp: 20 20    Temp: (!) 97.4 F (36.3 C) 97.9 F (36.6 C)    TempSrc: Oral Oral    SpO2: 95% 92% 96% 96%  Weight:  70.6 kg (155 lb 10.3 oz)    Height:        General: Pt is alert, awake, not in acute distress Cardiovascular: RRR, S1/S2 +, no rubs, no gallops Respiratory: CTA bilaterally, no wheezing, no rhonchi Abdominal: Soft, NT, ND, bowel sounds + Extremities: no edema, no cyanosis    The results of significant diagnostics from this hospitalization (including imaging, microbiology, ancillary and laboratory) are listed below for reference.     Microbiology: No results found for this or any previous visit (from the past 240 hour(s)).   Labs: BNP (last 3 results)  Recent Labs  08/10/16 0158  BNP >4,500.0*   Basic Metabolic Panel:  Recent Labs Lab 08/10/16 0112 08/11/16 0324  08/12/16 0239 08/13/16 0313  NA 136 137 135 133*  K 4.4 3.5 3.7 3.5  CL 101 100* 98* 94*  CO2 27 26 28 28   GLUCOSE 103* 148* 145* 134*  BUN 34* 34* 45* 63*  CREATININE 2.48* 2.24* 2.63* 2.95*  CALCIUM 8.7* 8.4* 8.5* 8.4*   Liver Function Tests: No results for input(s): AST, ALT, ALKPHOS, BILITOT, PROT, ALBUMIN in the last 168 hours. No results for input(s): LIPASE, AMYLASE in the last 168 hours. No results for input(s): AMMONIA in the last 168 hours. CBC:  Recent Labs Lab 08/10/16 0112 08/11/16 0927 08/12/16 0239  WBC 6.8 10.9* 11.5*  NEUTROABS 5.6  --   --   HGB 10.2* 10.1* 9.7*  HCT 31.1* 31.9* 30.4*  MCV 94.0 94.1 93.8  PLT 103* 126* 119*   Cardiac Enzymes: No results for input(s): CKTOTAL, CKMB, CKMBINDEX, TROPONINI in the last 168 hours. BNP: Invalid input(s): POCBNP CBG: No results for input(s): GLUCAP in the last 168 hours. D-Dimer No results for input(s): DDIMER in the last 72 hours. Hgb A1c No results for input(s): HGBA1C in the last 72 hours. Lipid Profile No results for input(s): CHOL, HDL, LDLCALC, TRIG, CHOLHDL, LDLDIRECT in the last 72 hours. Thyroid function studies No results for input(s): TSH, T4TOTAL, T3FREE, THYROIDAB in the last 72 hours.  Invalid input(s): FREET3 Anemia work up  Recent Labs  08/12/16 1445  VITAMINB12 525  FOLATE 13.0  FERRITIN 41  TIBC 472*  IRON 14*  RETICCTPCT 3.5*   Urinalysis No results found for: COLORURINE, APPEARANCEUR, LABSPEC, PHURINE, GLUCOSEU, HGBUR, BILIRUBINUR, KETONESUR, PROTEINUR, UROBILINOGEN, NITRITE, LEUKOCYTESUR Sepsis Labs Invalid input(s): PROCALCITONIN,  WBC,  LACTICIDVEN Microbiology No results found for this or any previous visit (from the past 240 hour(s)).   Time coordinating discharge: Over 30 minutes  SIGNED:   Kathlen Mody, MD  [facility] 08/13/2016, 11:49 AM Pager   If 7PM-7AM, please contact night-coverage [email or web address] Password [email or web address]

## 2016-08-13 NOTE — Progress Notes (Signed)
Physical Therapy Treatment Patient Details Name: Evan Weeks MRN: [id number] DOB: 1945/02/07 Today's Date: 08/13/2016    History of Present Illness Patient is a 71 yo male admitted 08/10/16 with increasing SOB and LE edema.  Two recent hospitalizations due to CHF exacerbation.    PMH::  CAD, COPC, emphysema, ICM, HTN, HLD, PAD, back surgery    PT Comments    Patient is making progress toward mobility goals and tolerated increased gait distance and stair negotiation. 2-3L O2 via [place] used during session with SpO2 85-94% throughout. Pt demonstrated carry over of pursed lip breathing technique. Current plan remains appropriate.    Follow Up Recommendations  Home health PT;Supervision - Intermittent     Equipment Recommendations  None recommended by PT    Recommendations for Other Services       Precautions / Restrictions Precautions Precautions: Fall Precaution Comments: watch O2 Restrictions Weight Bearing Restrictions: No    Mobility  Bed Mobility Overal bed mobility: Modified Independent                Transfers Overall transfer level: Modified independent                  Ambulation/Gait Ambulation/Gait assistance: Supervision Ambulation Distance (Feet): 300 Feet Assistive device: None (use of rail in hallway at times) Gait Pattern/deviations: Step-through pattern;Decreased stride length Gait velocity: decreased   General Gait Details: grossly steady gait; pt used rail in hallway at times and reported it helped to take pressure off of low back; no LOB or significant gait deviations with horizontal heads turns or directional changes   Stairs Stairs: Yes   Stair Management: One rail Left;Two rails;Step to pattern;Alternating pattern;Forwards Number of Stairs:  (5 steps X2) General stair comments: cues for step to pattern; rest break required after descending stairs each trial; first trial with 2L O2 and second with 3L O2 via [place]  Wheelchair  Mobility    Modified Rankin (Stroke Patients Only)       Balance Overall balance assessment: Needs assistance   Sitting balance-Leahy Scale: Good     Standing balance support: No upper extremity supported Standing balance-Leahy Scale: Good                              Cognition Arousal/Alertness: Awake/alert Behavior During Therapy: WFL for tasks assessed/performed Overall Cognitive Status: Within Functional Limits for tasks assessed                                        Exercises      General Comments General comments (skin integrity, edema, etc.): 3/4 DOE with stair negotiation; 2-3L O2 via [place] used during session; SpO2 85-94% throughout session; pt educated on pursed lip breathing with carry over demonstrated      Pertinent Vitals/Pain Pain Assessment: No/denies pain    Home Living                      Prior Function            PT Goals (current goals can now be found in the care plan section) Acute Rehab PT Goals PT Goal Formulation: With patient Time For Goal Achievement: 08/18/16 Potential to Achieve Goals: Good Progress towards PT goals: Progressing toward goals    Frequency    Min 3X/week  PT Plan Current plan remains appropriate    Co-evaluation              AM-PAC PT "6 Clicks" Daily Activity  Outcome Measure  Difficulty turning over in bed (including adjusting bedclothes, sheets and blankets)?: None Difficulty moving from lying on back to sitting on the side of the bed? : None Difficulty sitting down on and standing up from a chair with arms (e.g., wheelchair, bedside commode, etc,.)?: None Help needed moving to and from a bed to chair (including a wheelchair)?: A Little Help needed walking in hospital room?: A Little Help needed climbing 3-5 steps with a railing? : A Little 6 Click Score: 21    End of Session Equipment Utilized During Treatment: Gait belt;Oxygen Activity Tolerance:  Patient tolerated treatment well Patient left: with call bell/phone within reach;in chair Nurse Communication: Mobility status PT Visit Diagnosis: Unsteadiness on feet (R26.81);Muscle weakness (generalized) (M62.81)     Time: 1610-9604 PT Time Calculation (min) (ACUTE ONLY): 24 min  Charges:  $Gait Training: 8-22 mins $Therapeutic Activity: 8-22 mins                    G Codes:       Erline Levine, PTA Pager: [phone]     Carolynne Edouard 08/13/2016, 9:24 AM

## 2016-08-13 NOTE — Progress Notes (Signed)
Patient is active with [facility] services as prior to admission for [facility] and PT; Edwin[place] with [facility] made aware; resumption of services placed. Evan Weeks [facility] [phone]

## 2016-08-13 NOTE — Consult Note (Signed)
   [facility] 2 Inpatient Consult   08/13/2016  Evan Weeks [birth year] [id number]   Patient was evaluated for restart of services with [facility] in the [facility]. His  admission for shortness of breath with a HX of HF, 02 dependent for COPD, patient prefers to be called "[name]".  He states he had home health set up prior to admission but came to the hospital on Sunday the day before the agency was to start. Patient wasn't sure of the agency's name.  He endorses his primary care provider is Dr. Charlott Rakes.  Patient states he weighs and hopes to be able to present a list to his nurse that is start. Patient is interested in the [facility] program if available. Will follow up with [facility] office staff for referral.  No other needs expressed. A brochure with [facility] Care Management contact information given.  For questions, please contact:  Charlesetta Shanks, RN BSN CCM [facility]  [phone] business mobile phone Toll free office [phone]

## 2016-08-13 NOTE — Progress Notes (Signed)
Pt has orders to be discharged. Discharge instructions given and pt has no additional questions at this time. Medication regimen reviewed and pt educated. Pt verbalized understanding and has no additional questions. Telemetry box removed. IV removed and site in good condition. Pt stable and waiting for transportation. 

## 2016-08-13 NOTE — Progress Notes (Signed)
Progress Note  Patient Name: Evan Weeks Date of Encounter: 08/13/2016  Primary Cardiologist: Dr. Bing Matter  Subjective   The patient feels better today. Compression stockings helped with LE edema.  Inpatient Medications    Scheduled Meds: . aspirin EC  81 mg Oral Daily  . budesonide (PULMICORT) nebulizer solution  0.25 mg Nebulization BID  . furosemide  80 mg Intravenous BID  . gabapentin  400 mg Oral BID  . heparin  5,000 Units Subcutaneous Q8H  . hydrALAZINE  50 mg Oral TID  . ipratropium-albuterol  3 mL Inhalation QID  . isosorbide mononitrate  30 mg Oral Daily  . LORazepam  1 mg Oral QHS  . methylPREDNISolone (SOLU-MEDROL) injection  60 mg Intravenous Q12H  . metoprolol succinate  50 mg Oral BID  . ranolazine  500 mg Oral BID  . rosuvastatin  20 mg Oral Daily  . sodium chloride flush  3 mL Intravenous Q12H   Continuous Infusions: . sodium chloride     PRN Meds: sodium chloride, acetaminophen, albuterol, alum & mag hydroxide-simeth, labetalol, ondansetron (ZOFRAN) IV, sodium chloride flush   Vital Signs    Vitals:   08/12/16 2016 08/13/16 0533 08/13/16 0821 08/13/16 0822  BP: 137/69 117/61    Pulse: 69 64    Resp: 20 20    Temp: (!) 97.4 F (36.3 C) 97.9 F (36.6 C)    TempSrc: Oral Oral    SpO2: 95% 92% 96% 96%  Weight:  155 lb 10.3 oz (70.6 kg)    Height:        Intake/Output Summary (Last 24 hours) at 08/13/16 1041 Last data filed at 08/13/16 0815  Gross per 24 hour  Intake              480 ml  Output             2250 ml  Net            -1770 ml   Filed Weights   08/11/16 0552 08/12/16 0500 08/13/16 0533  Weight: 151 lb 12.8 oz (68.9 kg) 155 lb 1.6 oz (70.4 kg) 155 lb 10.3 oz (70.6 kg)    Telemetry    Atrial paced rhythm - Personally Reviewed   Physical Exam   GEN: Well nourished, well developed HEENT: normal  Neck: no JVD, carotid bruits, or masses Cardiac: RRR. no murmurs, rubs, or gallops. Intact distal pulses bilaterally. 1 +  pitting lower extremity edema Respiratory: clear to auscultation bilaterally, normal work of breathing + wheezing GI: soft, nontender, nondistended, + BS MS: no deformity or atrophy  Skin: warm and dry, no rash Neuro: Alert and Oriented x 3, Strength and sensation are intact Psych:   Full affect  Labs    Chemistry  Recent Labs Lab 08/11/16 0324 08/12/16 0239 08/13/16 0313  NA 137 135 133*  K 3.5 3.7 3.5  CL 100* 98* 94*  CO2 GLUCOSE 148* 145* 134*  BUN 34* 45* 63*  CREATININE 2.24* 2.63* 2.95*  CALCIUM 8.4* 8.5* 8.4*  GFRNONAA 28* 23* 20*  GFRAA 32* 27* 23*  ANIONGAP Hematology  Recent Labs Lab 08/10/16 0112 08/11/16 0927 08/12/16 0239 08/12/16 1445  WBC 6.8 10.9* 11.5*  --   RBC 3.31* 3.39* 3.24* 3.12*  HGB 10.2* 10.1* 9.7*  --   HCT 31.1* 31.9* 30.4*  --   MCV 94.0 94.1 93.8  --   MCH 30.8 29.8 29.9  --  MCHC 32.8 31.7 31.9  --   RDW 16.5* 16.6* 16.8*  --   PLT 103* 126* 119*  --     Cardiac EnzymesNo results for input(s): TROPONINI in the last 168 hours.   Recent Labs Lab 08/10/16 0121  TROPIPOC 0.05     BNP  Recent Labs Lab 08/10/16 0158  BNP >4,500.0*     DDimer No results for input(s): DDIMER in the last 168 hours.   Radiology    No results found.  Cardiac Studies   Echocardiogram 06/29/2016 Care Everywhere ------------------------------------------------------------------------------ Findings Mitral Valve Structurally normal mitral valve. Mild mitral regurgitation. Aortic Valve The aortic valve appears to be trileaflet. No aortic stenosis. No aortic regurgitation. Tricuspid Valve Tricuspid valve is structurally normal. Mild tricuspid regurgitation by color flow doppler examination. RVSP 25 mm Hg. Pulmonic Valve The pulmonic valve was not well visualized Mild pulmonic regurgitation. No evidence of pulmonic stenosis. Left Atrium Mild Left atrial enlargement. Left Ventricle Severe global LV  hypokinesis. Ejection fraction is visually estimated at 20-25% Right Atrium Normal right atrium. Right Ventricle Normal right ventricle structure and function. Pacer and/or defibrillator wires visualized in right ventricle. Pericardial Effusion No evidence of pericardial effusion. Miscellaneous The aorta is within normal limits. The IVC is dilated   Patient Profile     Evan Weeks a 71 y.o. malewith a hx of ischemic cardiomyopathy, CABG x3, CAD of his native vessels with stent placement, ICD hx of EF 30%, COPD, hypertension, hyperlipidemia, PAD, stage III kidney disease, CHF. He was admitted with acute on chronic combined systolic and diastolic CHF and acute on chronic COPD, he is an ongoing smoker.  Assessment & Plan     1. Acute on chronic combined CHF: Diuresing well on lasix 80 mg IV BID, creatinine worsening now 2.9 from baseline 2.2 - his lungs are clear he only has mild residual LE edema that has improved with compression stockings - we will swich to lasix 80 mg po BID  - continue TED hose at home - stable for a discharge from cardiac standpoint - follow up with Dr Gypsy Balsam within 7-10 days with BMP  2. History of COPD with possible concomitant COPD exacerbation: Medicine to manage.   3. Chronic kidney disease, stage LD:[id number] use BB because of his COPD.  No ACE inhibitor because of his CKD, Creatinine improving with diuresis  4. Hx of hypertension: Blood pressure seems to be responding well to the addition of Hydralazine 50 mg PO TID this admission.   5. Normocytic anemia: IM managing 6. Thrombocytopenia: IM managing  Tobias Alexander, MD 08/13/2016

## 2016-08-14 DIAGNOSIS — J449 Chronic obstructive pulmonary disease, unspecified: Secondary | ICD-10-CM | POA: Diagnosis not present

## 2016-08-14 DIAGNOSIS — R278 Other lack of coordination: Secondary | ICD-10-CM | POA: Diagnosis not present

## 2016-08-16 DIAGNOSIS — I509 Heart failure, unspecified: Secondary | ICD-10-CM | POA: Diagnosis not present

## 2016-08-16 DIAGNOSIS — J441 Chronic obstructive pulmonary disease with (acute) exacerbation: Secondary | ICD-10-CM | POA: Diagnosis not present

## 2016-08-17 DIAGNOSIS — R278 Other lack of coordination: Secondary | ICD-10-CM | POA: Diagnosis not present

## 2016-08-17 DIAGNOSIS — J449 Chronic obstructive pulmonary disease, unspecified: Secondary | ICD-10-CM | POA: Diagnosis not present

## 2016-08-19 DIAGNOSIS — J449 Chronic obstructive pulmonary disease, unspecified: Secondary | ICD-10-CM | POA: Diagnosis not present

## 2016-08-19 DIAGNOSIS — R278 Other lack of coordination: Secondary | ICD-10-CM | POA: Diagnosis not present

## 2016-08-20 DIAGNOSIS — J449 Chronic obstructive pulmonary disease, unspecified: Secondary | ICD-10-CM | POA: Diagnosis not present

## 2016-08-20 DIAGNOSIS — N184 Chronic kidney disease, stage 4 (severe): Secondary | ICD-10-CM | POA: Diagnosis not present

## 2016-08-20 DIAGNOSIS — I509 Heart failure, unspecified: Secondary | ICD-10-CM | POA: Diagnosis not present

## 2016-08-21 ENCOUNTER — Encounter: Payer: Self-pay | Admitting: Cardiology

## 2016-08-21 ENCOUNTER — Ambulatory Visit (INDEPENDENT_AMBULATORY_CARE_PROVIDER_SITE_OTHER): Payer: Medicare Other | Admitting: Cardiology

## 2016-08-21 VITALS — BP 124/60 | HR 61 | Ht 65.5 in | Wt 157.8 lb

## 2016-08-21 DIAGNOSIS — I2510 Atherosclerotic heart disease of native coronary artery without angina pectoris: Secondary | ICD-10-CM

## 2016-08-21 DIAGNOSIS — I255 Ischemic cardiomyopathy: Secondary | ICD-10-CM

## 2016-08-21 DIAGNOSIS — F17210 Nicotine dependence, cigarettes, uncomplicated: Secondary | ICD-10-CM | POA: Diagnosis not present

## 2016-08-21 DIAGNOSIS — N19 Unspecified kidney failure: Secondary | ICD-10-CM | POA: Diagnosis not present

## 2016-08-21 DIAGNOSIS — N179 Acute kidney failure, unspecified: Secondary | ICD-10-CM | POA: Diagnosis not present

## 2016-08-21 DIAGNOSIS — Z95810 Presence of automatic (implantable) cardiac defibrillator: Secondary | ICD-10-CM

## 2016-08-21 DIAGNOSIS — I129 Hypertensive chronic kidney disease with stage 1 through stage 4 chronic kidney disease, or unspecified chronic kidney disease: Secondary | ICD-10-CM | POA: Diagnosis not present

## 2016-08-21 DIAGNOSIS — I1310 Hypertensive heart and chronic kidney disease without heart failure, with stage 1 through stage 4 chronic kidney disease, or unspecified chronic kidney disease: Secondary | ICD-10-CM | POA: Diagnosis not present

## 2016-08-21 DIAGNOSIS — D509 Iron deficiency anemia, unspecified: Secondary | ICD-10-CM | POA: Diagnosis not present

## 2016-08-21 NOTE — Progress Notes (Signed)
Cardiology Office Note:    Date:  08/21/2016   ID:  Evan Weeks, DOB [birth year], MRN [id number]  PCP:  Charlott Rakes, MD  Cardiologist:  Gypsy Balsam, MD    Referring MD: Charlott Rakes, MD   Chief Complaint  Patient presents with  . Hospitalization Follow-up  This posthospital office visit  History of Present Illness:    Evan Weeks is a 71 y.o. male  with significant cardiomyopathy and COPD. Recently he ended up going back to hospital because of shortness of breath. He was diuresed appropriately and feeling better. Overall he is in very poor shape. He does have recurrent admissions to hospital. We talked again about weight management. I told them that if his weight which are 55 he needs to let me know. He seen a nephrologist today who increased the dose of furosemide. We'll follow-up Chem-7. I will interrogate his device today to see if there is any regarding congestive heart failure available in the device. I see him back in 2 weeks.  Past Medical History:  Diagnosis Date  . CAD (coronary artery disease)   . COPD (chronic obstructive pulmonary disease) (HCC)   . Emphysema of lung (HCC)   . HLD (hyperlipidemia)   . HTN (hypertension)   . Ischemic cardiomyopathy   . PAD (peripheral artery disease) (HCC)     Past Surgical History:  Procedure Laterality Date  . AICD pacemaker    . BACK SURGERY    . SPINE SURGERY      Current Medications: Current Meds  Medication Sig  . albuterol (PROVENTIL) (2.5 MG/3ML) 0.083% nebulizer solution Take 2.5 mg by nebulization every 6 (six) hours as needed for wheezing or shortness of breath.  Marland Kitchen aspirin 81 MG tablet Take 81 mg by mouth daily.    . fluticasone furoate-vilanterol (BREO ELLIPTA) 200-25 MCG/INH AEPB Inhale 1 puff into the lungs daily.  . furosemide (LASIX) 40 MG tablet Take 80 mg by mouth 3 (three) times daily.   Marland Kitchen gabapentin (NEURONTIN) 400 MG capsule Take 400 mg by mouth 2 (two) times daily.   . hydrALAZINE  (APRESOLINE) 50 MG tablet Take 1 tablet (50 mg total) by mouth 3 (three) times daily.  . Ipratropium-Albuterol (COMBIVENT RESPIMAT) 20-100 MCG/ACT AERS respimat Inhale 1 puff into the lungs 4 (four) times daily.  . isosorbide mononitrate (IMDUR) 30 MG 24 hr tablet Take 30 mg by mouth daily.  Marland Kitchen LORazepam (ATIVAN) 1 MG tablet Take 1 mg by mouth at bedtime.  . metoprolol succinate (TOPROL-XL) 50 MG 24 hr tablet Take 50 mg by mouth 2 (two) times daily. Take with or immediately following a meal.  . nitroGLYCERIN (NITROSTAT) 0.4 MG SL tablet Place 0.4 mg under the tongue every 5 (five) minutes as needed for chest pain.  . predniSONE (STERAPRED UNI-PAK 21 TAB) 10 MG (21) TBPK tablet Prednisone 60 mg daily for 2 days followed by  Prednisone 40 mg daily for 3 days followed by  Prednisone 20 mg daily for 3 days and stop.  . ranolazine (RANEXA) 500 MG 12 hr tablet Take 500 mg by mouth 2 (two) times daily.  . rosuvastatin (CRESTOR) 20 MG tablet Take 20 mg by mouth daily.     Allergies:   Patient has no known allergies.   Social History   Social History  . Marital status: Married    Spouse name: N/A  . Number of children: 4  . Years of education: N/A   Social History Main Topics  . Smoking status:  Current Every Day Smoker    Packs/day: 1.00    Years: 54.00  . Smokeless tobacco: Current User    Types: Chew     Comment: started when he was 10  . Alcohol use No  . Drug use: No  . Sexual activity: Not Asked   Other Topics Concern  . None   Social History Narrative  . None     Family History: The patient's family history includes Asthma in his father; Drug abuse in his paternal grandfather; Emphysema in his father; Stroke in his mother. ROS:   Please see the history of present illness.    All 14 point review of systems negative except as described per history of present illness  EKGs/Labs/Other Studies Reviewed:      Recent Labs: 08/03/2016: NT-Pro BNP 32,349 08/10/2016: B  Natriuretic Peptide >4,500.0 08/12/2016: Hemoglobin 9.7; Platelets 119 08/13/2016: BUN 63; Creatinine, Ser 2.95; Potassium 3.5; Sodium 133  Recent Lipid Panel No results found for: CHOL, TRIG, HDL, CHOLHDL, VLDL, LDLCALC, LDLDIRECT  Physical Exam:    VS:  BP 124/60   Pulse 61   Ht 5' 5.5" (1.664 m)   Wt 157 lb 12.8 oz (71.6 kg)   SpO2 98% Comment: 2 lt O2  BMI 25.86 kg/m     Wt Readings from Last 3 Encounters:  08/21/16 157 lb 12.8 oz (71.6 kg)  08/13/16 155 lb 10.3 oz (70.6 kg)  08/03/16 160 lb (72.6 kg)     GEN:  Well nourished, well developed in no acute distress HEENT: Normal NECK: No JVD; No carotid bruits LYMPHATICS: No lymphadenopathy CARDIAC: RRR, no murmurs, no rubs, no gallops RESPIRATORY:  Clear to auscultation without rales, wheezing or rhonchi  ABDOMEN: Soft, non-tender, non-distended MUSCULOSKELETAL:  No edema; No deformity  SKIN: Warm and dry LOWER EXTREMITIES: no swelling NEUROLOGIC:  Alert and oriented x 3 PSYCHIATRIC:  Normal affect   ASSESSMENT:    1. CAD in native artery   2. Ischemic cardiomyopathy   3. Automatic implantable cardioverter-defibrillator in situ   4. Cigarette smoker    PLAN:    In order of problems listed above:  1. Coronary artery disease: But seems to be stable. 2. Ischemic cardiomyopathy with recurrent admission to the hospital. He did see already nephrologist today we increased dose of furosemide. He does have appointment in about 2 weeks. I will see him shortly after that. He will require frequent office visits. I will try to see him every 2 weeks. 3. ICD Biotronik device: We'll interrogate device today. 4. Smoking sadly still continued.  Overall gentleman with coronary myopathy and COPD. Recurrent admissions to the hospital. Very poor prognosis. Family aware.   Medication Adjustments/Labs and Tests Ordered: Current medicines are reviewed at length with the patient today.  Concerns regarding medicines are outlined above.  No  orders of the defined types were placed in this encounter.  Medication changes: No orders of the defined types were placed in this encounter.   Signed, Georgeanna Lea, MD, [facility] 08/21/2016 11:37 AM    [facility]

## 2016-08-21 NOTE — Patient Instructions (Signed)
Medication Instructions:  Your physician recommends that you continue on your current medications as directed. Please refer to the Current Medication list given to you today.   Labwork: None Ordered  Testing/Procedures: Check PPM  Follow-Up: Your physician recommends that you schedule a follow-up appointment in: 2 weeks with Dr. Bing Matter   Any Other Special Instructions Will Be Listed Below (If Applicable).     If you need a refill on your cardiac medications before your next appointment, please call your pharmacy.

## 2016-08-24 ENCOUNTER — Other Ambulatory Visit: Payer: Self-pay | Admitting: *Deleted

## 2016-08-24 DIAGNOSIS — R609 Edema, unspecified: Secondary | ICD-10-CM | POA: Diagnosis not present

## 2016-08-24 DIAGNOSIS — N186 End stage renal disease: Secondary | ICD-10-CM | POA: Diagnosis not present

## 2016-08-24 DIAGNOSIS — G4700 Insomnia, unspecified: Secondary | ICD-10-CM | POA: Diagnosis not present

## 2016-08-24 DIAGNOSIS — Z95810 Presence of automatic (implantable) cardiac defibrillator: Secondary | ICD-10-CM | POA: Diagnosis not present

## 2016-08-24 DIAGNOSIS — Z7982 Long term (current) use of aspirin: Secondary | ICD-10-CM | POA: Diagnosis not present

## 2016-08-24 DIAGNOSIS — I25810 Atherosclerosis of coronary artery bypass graft(s) without angina pectoris: Secondary | ICD-10-CM | POA: Diagnosis not present

## 2016-08-24 DIAGNOSIS — E875 Hyperkalemia: Secondary | ICD-10-CM | POA: Diagnosis not present

## 2016-08-24 DIAGNOSIS — E889 Metabolic disorder, unspecified: Secondary | ICD-10-CM | POA: Diagnosis not present

## 2016-08-24 DIAGNOSIS — R601 Generalized edema: Secondary | ICD-10-CM | POA: Diagnosis not present

## 2016-08-24 DIAGNOSIS — I501 Left ventricular failure: Secondary | ICD-10-CM | POA: Diagnosis not present

## 2016-08-24 DIAGNOSIS — I5023 Acute on chronic systolic (congestive) heart failure: Secondary | ICD-10-CM | POA: Diagnosis not present

## 2016-08-24 DIAGNOSIS — E876 Hypokalemia: Secondary | ICD-10-CM | POA: Diagnosis not present

## 2016-08-24 DIAGNOSIS — Z4901 Encounter for fitting and adjustment of extracorporeal dialysis catheter: Secondary | ICD-10-CM | POA: Diagnosis not present

## 2016-08-24 DIAGNOSIS — I2510 Atherosclerotic heart disease of native coronary artery without angina pectoris: Secondary | ICD-10-CM | POA: Diagnosis not present

## 2016-08-24 DIAGNOSIS — I429 Cardiomyopathy, unspecified: Secondary | ICD-10-CM | POA: Diagnosis not present

## 2016-08-24 DIAGNOSIS — R0600 Dyspnea, unspecified: Secondary | ICD-10-CM | POA: Diagnosis not present

## 2016-08-24 DIAGNOSIS — I252 Old myocardial infarction: Secondary | ICD-10-CM | POA: Diagnosis not present

## 2016-08-24 DIAGNOSIS — J441 Chronic obstructive pulmonary disease with (acute) exacerbation: Secondary | ICD-10-CM | POA: Diagnosis not present

## 2016-08-24 DIAGNOSIS — I509 Heart failure, unspecified: Secondary | ICD-10-CM | POA: Diagnosis not present

## 2016-08-24 DIAGNOSIS — E871 Hypo-osmolality and hyponatremia: Secondary | ICD-10-CM | POA: Diagnosis not present

## 2016-08-24 DIAGNOSIS — I952 Hypotension due to drugs: Secondary | ICD-10-CM | POA: Diagnosis not present

## 2016-08-24 DIAGNOSIS — Z951 Presence of aortocoronary bypass graft: Secondary | ICD-10-CM | POA: Diagnosis not present

## 2016-08-24 DIAGNOSIS — N184 Chronic kidney disease, stage 4 (severe): Secondary | ICD-10-CM | POA: Diagnosis not present

## 2016-08-24 DIAGNOSIS — N179 Acute kidney failure, unspecified: Secondary | ICD-10-CM | POA: Diagnosis not present

## 2016-08-24 DIAGNOSIS — I739 Peripheral vascular disease, unspecified: Secondary | ICD-10-CM | POA: Diagnosis not present

## 2016-08-24 DIAGNOSIS — M1990 Unspecified osteoarthritis, unspecified site: Secondary | ICD-10-CM | POA: Diagnosis not present

## 2016-08-24 DIAGNOSIS — J9611 Chronic respiratory failure with hypoxia: Secondary | ICD-10-CM | POA: Diagnosis not present

## 2016-08-24 DIAGNOSIS — I10 Essential (primary) hypertension: Secondary | ICD-10-CM | POA: Diagnosis not present

## 2016-08-24 DIAGNOSIS — I130 Hypertensive heart and chronic kidney disease with heart failure and stage 1 through stage 4 chronic kidney disease, or unspecified chronic kidney disease: Secondary | ICD-10-CM | POA: Diagnosis not present

## 2016-08-24 DIAGNOSIS — M7989 Other specified soft tissue disorders: Secondary | ICD-10-CM | POA: Diagnosis not present

## 2016-08-24 DIAGNOSIS — D649 Anemia, unspecified: Secondary | ICD-10-CM | POA: Diagnosis not present

## 2016-08-24 DIAGNOSIS — D696 Thrombocytopenia, unspecified: Secondary | ICD-10-CM | POA: Diagnosis not present

## 2016-08-24 DIAGNOSIS — I255 Ischemic cardiomyopathy: Secondary | ICD-10-CM | POA: Diagnosis not present

## 2016-08-24 DIAGNOSIS — N049 Nephrotic syndrome with unspecified morphologic changes: Secondary | ICD-10-CM | POA: Diagnosis not present

## 2016-08-24 DIAGNOSIS — Z992 Dependence on renal dialysis: Secondary | ICD-10-CM | POA: Diagnosis not present

## 2016-08-24 DIAGNOSIS — N189 Chronic kidney disease, unspecified: Secondary | ICD-10-CM | POA: Diagnosis not present

## 2016-08-24 DIAGNOSIS — J918 Pleural effusion in other conditions classified elsewhere: Secondary | ICD-10-CM | POA: Diagnosis not present

## 2016-08-24 DIAGNOSIS — J449 Chronic obstructive pulmonary disease, unspecified: Secondary | ICD-10-CM | POA: Diagnosis not present

## 2016-08-24 DIAGNOSIS — D631 Anemia in chronic kidney disease: Secondary | ICD-10-CM | POA: Diagnosis not present

## 2016-08-24 DIAGNOSIS — I132 Hypertensive heart and chronic kidney disease with heart failure and with stage 5 chronic kidney disease, or end stage renal disease: Secondary | ICD-10-CM | POA: Diagnosis not present

## 2016-08-24 DIAGNOSIS — G4733 Obstructive sleep apnea (adult) (pediatric): Secondary | ICD-10-CM | POA: Diagnosis not present

## 2016-08-24 DIAGNOSIS — Z955 Presence of coronary angioplasty implant and graft: Secondary | ICD-10-CM | POA: Diagnosis not present

## 2016-08-24 DIAGNOSIS — Z720 Tobacco use: Secondary | ICD-10-CM | POA: Diagnosis not present

## 2016-08-24 DIAGNOSIS — N19 Unspecified kidney failure: Secondary | ICD-10-CM | POA: Diagnosis not present

## 2016-08-24 DIAGNOSIS — I081 Rheumatic disorders of both mitral and tricuspid valves: Secondary | ICD-10-CM | POA: Diagnosis not present

## 2016-08-24 DIAGNOSIS — I420 Dilated cardiomyopathy: Secondary | ICD-10-CM | POA: Diagnosis not present

## 2016-08-24 DIAGNOSIS — E8779 Other fluid overload: Secondary | ICD-10-CM | POA: Diagnosis not present

## 2016-08-24 NOTE — Patient Outreach (Signed)
[facility]) Care Management  08/24/2016  Evan Weeks [birth year] [id number]  EMMI- Heart Failure RED ON EMMI ALERT DAY#: 9 DATE: 08/23/16 RED ALERT: Weight? 152lbs, Filled new prescriptions? No Know exactly which meds to take? No  Outreach attempt #1 to patient. Spoke with patient. Patient requested a return phone call.  Plan:  RN CM will contact patient within 1 week.  Wynelle Cleveland, RN, BSN, MHA/MSL, [facility] Telephonic Care Manager Coordinator [facility] Direct Phone: [phone] Toll Free: [phone] Fax: [phone]

## 2016-08-25 ENCOUNTER — Ambulatory Visit: Payer: Self-pay | Admitting: *Deleted

## 2016-08-25 ENCOUNTER — Other Ambulatory Visit: Payer: Self-pay | Admitting: *Deleted

## 2016-08-25 NOTE — Patient Outreach (Signed)
[facility]) Care Management  08/25/2016  CHANO RINNE January 28, 1945 [id number]  EMMI- Heart Failure RED ON EMMI ALERT DAY#: 9 DATE: 08/23/16 RED ALERT: Weight? 152lbs, Filled new prescriptions? No Know exactly which meds to take? No  Outreach attempt #2 to patient.  No answer. RN CM left HIPAA compliant message along with contact info.   Plan:  RN CM will contact patient within 1 week.  Wynelle Cleveland, RN, BSN, MHA/MSL, [facility] Telephonic Care Manager Coordinator [facility] Direct Phone: [phone] Toll Free: [phone] Fax: [phone]

## 2016-08-26 ENCOUNTER — Other Ambulatory Visit: Payer: Self-pay | Admitting: *Deleted

## 2016-08-26 ENCOUNTER — Ambulatory Visit: Payer: Self-pay | Admitting: *Deleted

## 2016-08-26 NOTE — Patient Outreach (Signed)
[facility]) Care Management  08/26/2016  Evan Weeks Dec 16, 1945 [id number]   EMMI- Heart Failure RED ON EMMI ALERT DAY#: 9 DATE: 08/23/16 RED ALERT: Weight? 152lbs, Filled new prescriptions? No Know exactly which meds to take? No  Incoming call from patient's daughter. Daughter stated, patient was re-admitted to [facility] om 08/24/16.   Plan: RN CM will notify [facility] CM administrative assistant regarding case closure and to deactivate EMMI automated phone calls.  Wynelle Cleveland, RN, BSN, MHA/MSL, [facility] Telephonic Care Manager Coordinator [facility] Direct Phone: [phone] Toll Free: [phone] Fax: [phone]

## 2016-09-02 DIAGNOSIS — N2581 Secondary hyperparathyroidism of renal origin: Secondary | ICD-10-CM | POA: Diagnosis not present

## 2016-09-02 DIAGNOSIS — D509 Iron deficiency anemia, unspecified: Secondary | ICD-10-CM | POA: Diagnosis not present

## 2016-09-02 DIAGNOSIS — N186 End stage renal disease: Secondary | ICD-10-CM | POA: Diagnosis not present

## 2016-09-02 DIAGNOSIS — D631 Anemia in chronic kidney disease: Secondary | ICD-10-CM | POA: Diagnosis not present

## 2016-09-02 DIAGNOSIS — I509 Heart failure, unspecified: Secondary | ICD-10-CM | POA: Diagnosis not present

## 2016-09-02 DIAGNOSIS — Z23 Encounter for immunization: Secondary | ICD-10-CM | POA: Diagnosis not present

## 2016-09-04 ENCOUNTER — Ambulatory Visit: Payer: Medicare Other | Admitting: Cardiology

## 2016-09-04 DIAGNOSIS — J449 Chronic obstructive pulmonary disease, unspecified: Secondary | ICD-10-CM | POA: Diagnosis not present

## 2016-09-04 DIAGNOSIS — I509 Heart failure, unspecified: Secondary | ICD-10-CM | POA: Diagnosis not present

## 2016-09-04 DIAGNOSIS — N186 End stage renal disease: Secondary | ICD-10-CM | POA: Diagnosis not present

## 2016-09-04 DIAGNOSIS — G4700 Insomnia, unspecified: Secondary | ICD-10-CM | POA: Diagnosis not present

## 2016-09-04 DIAGNOSIS — Z7189 Other specified counseling: Secondary | ICD-10-CM | POA: Diagnosis not present

## 2016-09-04 DIAGNOSIS — N184 Chronic kidney disease, stage 4 (severe): Secondary | ICD-10-CM | POA: Diagnosis not present

## 2016-09-04 DIAGNOSIS — Z23 Encounter for immunization: Secondary | ICD-10-CM | POA: Diagnosis not present

## 2016-09-04 DIAGNOSIS — D509 Iron deficiency anemia, unspecified: Secondary | ICD-10-CM | POA: Diagnosis not present

## 2016-09-04 DIAGNOSIS — D631 Anemia in chronic kidney disease: Secondary | ICD-10-CM | POA: Diagnosis not present

## 2016-09-04 DIAGNOSIS — N2581 Secondary hyperparathyroidism of renal origin: Secondary | ICD-10-CM | POA: Diagnosis not present

## 2016-09-07 DIAGNOSIS — N2581 Secondary hyperparathyroidism of renal origin: Secondary | ICD-10-CM | POA: Diagnosis not present

## 2016-09-07 DIAGNOSIS — Z23 Encounter for immunization: Secondary | ICD-10-CM | POA: Diagnosis not present

## 2016-09-07 DIAGNOSIS — N186 End stage renal disease: Secondary | ICD-10-CM | POA: Diagnosis not present

## 2016-09-07 DIAGNOSIS — D631 Anemia in chronic kidney disease: Secondary | ICD-10-CM | POA: Diagnosis not present

## 2016-09-07 DIAGNOSIS — N184 Chronic kidney disease, stage 4 (severe): Secondary | ICD-10-CM | POA: Diagnosis not present

## 2016-09-07 DIAGNOSIS — D509 Iron deficiency anemia, unspecified: Secondary | ICD-10-CM | POA: Diagnosis not present

## 2016-09-07 DIAGNOSIS — G4700 Insomnia, unspecified: Secondary | ICD-10-CM | POA: Diagnosis not present

## 2016-09-07 DIAGNOSIS — I509 Heart failure, unspecified: Secondary | ICD-10-CM | POA: Diagnosis not present

## 2016-09-07 DIAGNOSIS — J449 Chronic obstructive pulmonary disease, unspecified: Secondary | ICD-10-CM | POA: Diagnosis not present

## 2016-09-08 ENCOUNTER — Telehealth: Payer: Self-pay

## 2016-09-08 DIAGNOSIS — R278 Other lack of coordination: Secondary | ICD-10-CM | POA: Diagnosis not present

## 2016-09-08 DIAGNOSIS — J449 Chronic obstructive pulmonary disease, unspecified: Secondary | ICD-10-CM | POA: Diagnosis not present

## 2016-09-08 MED ORDER — HYDRALAZINE HCL 50 MG PO TABS: 50.0000 mg | ORAL_TABLET | Freq: Three times a day (TID) | ORAL | 2 refills | Status: AC

## 2016-09-08 NOTE — Telephone Encounter (Signed)
Refill request sent from pharmacy, refills provided for Hydralazine

## 2016-09-09 DIAGNOSIS — Z23 Encounter for immunization: Secondary | ICD-10-CM | POA: Diagnosis not present

## 2016-09-09 DIAGNOSIS — N2581 Secondary hyperparathyroidism of renal origin: Secondary | ICD-10-CM | POA: Diagnosis not present

## 2016-09-09 DIAGNOSIS — N186 End stage renal disease: Secondary | ICD-10-CM | POA: Diagnosis not present

## 2016-09-09 DIAGNOSIS — D509 Iron deficiency anemia, unspecified: Secondary | ICD-10-CM | POA: Diagnosis not present

## 2016-09-09 DIAGNOSIS — D631 Anemia in chronic kidney disease: Secondary | ICD-10-CM | POA: Diagnosis not present

## 2016-09-10 DIAGNOSIS — R278 Other lack of coordination: Secondary | ICD-10-CM | POA: Diagnosis not present

## 2016-09-10 DIAGNOSIS — J449 Chronic obstructive pulmonary disease, unspecified: Secondary | ICD-10-CM | POA: Diagnosis not present

## 2016-09-11 DIAGNOSIS — D631 Anemia in chronic kidney disease: Secondary | ICD-10-CM | POA: Diagnosis not present

## 2016-09-11 DIAGNOSIS — Z23 Encounter for immunization: Secondary | ICD-10-CM | POA: Diagnosis not present

## 2016-09-11 DIAGNOSIS — Z992 Dependence on renal dialysis: Secondary | ICD-10-CM | POA: Diagnosis not present

## 2016-09-11 DIAGNOSIS — N2581 Secondary hyperparathyroidism of renal origin: Secondary | ICD-10-CM | POA: Diagnosis not present

## 2016-09-11 DIAGNOSIS — N186 End stage renal disease: Secondary | ICD-10-CM | POA: Diagnosis not present

## 2016-09-11 DIAGNOSIS — D509 Iron deficiency anemia, unspecified: Secondary | ICD-10-CM | POA: Diagnosis not present

## 2016-09-14 DIAGNOSIS — N186 End stage renal disease: Secondary | ICD-10-CM | POA: Diagnosis not present

## 2016-09-14 DIAGNOSIS — D509 Iron deficiency anemia, unspecified: Secondary | ICD-10-CM | POA: Diagnosis not present

## 2016-09-15 DIAGNOSIS — R278 Other lack of coordination: Secondary | ICD-10-CM | POA: Diagnosis not present

## 2016-09-15 DIAGNOSIS — J449 Chronic obstructive pulmonary disease, unspecified: Secondary | ICD-10-CM | POA: Diagnosis not present

## 2016-09-16 DIAGNOSIS — J441 Chronic obstructive pulmonary disease with (acute) exacerbation: Secondary | ICD-10-CM | POA: Diagnosis not present

## 2016-09-16 DIAGNOSIS — N186 End stage renal disease: Secondary | ICD-10-CM | POA: Diagnosis not present

## 2016-09-16 DIAGNOSIS — D509 Iron deficiency anemia, unspecified: Secondary | ICD-10-CM | POA: Diagnosis not present

## 2016-09-16 DIAGNOSIS — I509 Heart failure, unspecified: Secondary | ICD-10-CM | POA: Diagnosis not present

## 2016-09-18 DIAGNOSIS — J449 Chronic obstructive pulmonary disease, unspecified: Secondary | ICD-10-CM | POA: Diagnosis not present

## 2016-09-18 DIAGNOSIS — D509 Iron deficiency anemia, unspecified: Secondary | ICD-10-CM | POA: Diagnosis not present

## 2016-09-18 DIAGNOSIS — R278 Other lack of coordination: Secondary | ICD-10-CM | POA: Diagnosis not present

## 2016-09-18 DIAGNOSIS — N186 End stage renal disease: Secondary | ICD-10-CM | POA: Diagnosis not present

## 2016-09-21 DIAGNOSIS — N186 End stage renal disease: Secondary | ICD-10-CM | POA: Diagnosis not present

## 2016-09-21 DIAGNOSIS — D509 Iron deficiency anemia, unspecified: Secondary | ICD-10-CM | POA: Diagnosis not present

## 2016-09-22 DIAGNOSIS — Z992 Dependence on renal dialysis: Secondary | ICD-10-CM | POA: Diagnosis not present

## 2016-09-22 DIAGNOSIS — N186 End stage renal disease: Secondary | ICD-10-CM | POA: Diagnosis not present

## 2016-09-23 DIAGNOSIS — D509 Iron deficiency anemia, unspecified: Secondary | ICD-10-CM | POA: Diagnosis not present

## 2016-09-23 DIAGNOSIS — D631 Anemia in chronic kidney disease: Secondary | ICD-10-CM | POA: Diagnosis not present

## 2016-09-23 DIAGNOSIS — N186 End stage renal disease: Secondary | ICD-10-CM | POA: Diagnosis not present

## 2016-09-24 DIAGNOSIS — I739 Peripheral vascular disease, unspecified: Secondary | ICD-10-CM | POA: Diagnosis not present

## 2016-09-24 DIAGNOSIS — I2510 Atherosclerotic heart disease of native coronary artery without angina pectoris: Secondary | ICD-10-CM | POA: Diagnosis not present

## 2016-09-24 DIAGNOSIS — I509 Heart failure, unspecified: Secondary | ICD-10-CM | POA: Diagnosis not present

## 2016-09-24 DIAGNOSIS — N186 End stage renal disease: Secondary | ICD-10-CM | POA: Diagnosis not present

## 2016-09-25 DIAGNOSIS — N186 End stage renal disease: Secondary | ICD-10-CM | POA: Diagnosis not present

## 2016-09-25 DIAGNOSIS — D631 Anemia in chronic kidney disease: Secondary | ICD-10-CM | POA: Diagnosis not present

## 2016-09-25 DIAGNOSIS — D509 Iron deficiency anemia, unspecified: Secondary | ICD-10-CM | POA: Diagnosis not present

## 2016-09-28 DIAGNOSIS — N186 End stage renal disease: Secondary | ICD-10-CM | POA: Diagnosis not present

## 2016-09-28 DIAGNOSIS — D509 Iron deficiency anemia, unspecified: Secondary | ICD-10-CM | POA: Diagnosis not present

## 2016-09-28 DIAGNOSIS — D631 Anemia in chronic kidney disease: Secondary | ICD-10-CM | POA: Diagnosis not present

## 2016-09-30 DIAGNOSIS — D631 Anemia in chronic kidney disease: Secondary | ICD-10-CM | POA: Diagnosis not present

## 2016-09-30 DIAGNOSIS — D509 Iron deficiency anemia, unspecified: Secondary | ICD-10-CM | POA: Diagnosis not present

## 2016-09-30 DIAGNOSIS — N186 End stage renal disease: Secondary | ICD-10-CM | POA: Diagnosis not present

## 2016-10-02 DIAGNOSIS — N186 End stage renal disease: Secondary | ICD-10-CM | POA: Diagnosis not present

## 2016-10-02 DIAGNOSIS — Z23 Encounter for immunization: Secondary | ICD-10-CM | POA: Diagnosis not present

## 2016-10-02 DIAGNOSIS — D509 Iron deficiency anemia, unspecified: Secondary | ICD-10-CM | POA: Diagnosis not present

## 2016-10-02 DIAGNOSIS — D631 Anemia in chronic kidney disease: Secondary | ICD-10-CM | POA: Diagnosis not present

## 2016-10-03 DIAGNOSIS — I509 Heart failure, unspecified: Secondary | ICD-10-CM | POA: Diagnosis not present

## 2016-10-05 DIAGNOSIS — N186 End stage renal disease: Secondary | ICD-10-CM | POA: Diagnosis not present

## 2016-10-05 DIAGNOSIS — D631 Anemia in chronic kidney disease: Secondary | ICD-10-CM | POA: Diagnosis not present

## 2016-10-05 DIAGNOSIS — M109 Gout, unspecified: Secondary | ICD-10-CM | POA: Diagnosis not present

## 2016-10-05 DIAGNOSIS — D509 Iron deficiency anemia, unspecified: Secondary | ICD-10-CM | POA: Diagnosis not present

## 2016-10-05 DIAGNOSIS — Z23 Encounter for immunization: Secondary | ICD-10-CM | POA: Diagnosis not present

## 2016-10-06 DIAGNOSIS — M7022 Olecranon bursitis, left elbow: Secondary | ICD-10-CM | POA: Diagnosis not present

## 2016-10-06 DIAGNOSIS — M2540 Effusion, unspecified joint: Secondary | ICD-10-CM | POA: Diagnosis not present

## 2016-10-06 DIAGNOSIS — Z23 Encounter for immunization: Secondary | ICD-10-CM | POA: Diagnosis not present

## 2016-10-07 DIAGNOSIS — Z23 Encounter for immunization: Secondary | ICD-10-CM | POA: Diagnosis not present

## 2016-10-07 DIAGNOSIS — D509 Iron deficiency anemia, unspecified: Secondary | ICD-10-CM | POA: Diagnosis not present

## 2016-10-07 DIAGNOSIS — D631 Anemia in chronic kidney disease: Secondary | ICD-10-CM | POA: Diagnosis not present

## 2016-10-07 DIAGNOSIS — N186 End stage renal disease: Secondary | ICD-10-CM | POA: Diagnosis not present

## 2016-10-09 DIAGNOSIS — D509 Iron deficiency anemia, unspecified: Secondary | ICD-10-CM | POA: Diagnosis not present

## 2016-10-09 DIAGNOSIS — Z23 Encounter for immunization: Secondary | ICD-10-CM | POA: Diagnosis not present

## 2016-10-09 DIAGNOSIS — N186 End stage renal disease: Secondary | ICD-10-CM | POA: Diagnosis not present

## 2016-10-09 DIAGNOSIS — D631 Anemia in chronic kidney disease: Secondary | ICD-10-CM | POA: Diagnosis not present

## 2016-10-11 DIAGNOSIS — N186 End stage renal disease: Secondary | ICD-10-CM | POA: Diagnosis not present

## 2016-10-11 DIAGNOSIS — Z992 Dependence on renal dialysis: Secondary | ICD-10-CM | POA: Diagnosis not present

## 2016-10-12 DIAGNOSIS — N2581 Secondary hyperparathyroidism of renal origin: Secondary | ICD-10-CM | POA: Diagnosis not present

## 2016-10-12 DIAGNOSIS — N186 End stage renal disease: Secondary | ICD-10-CM | POA: Diagnosis not present

## 2016-10-12 DIAGNOSIS — D509 Iron deficiency anemia, unspecified: Secondary | ICD-10-CM | POA: Diagnosis not present

## 2016-10-13 DIAGNOSIS — I2510 Atherosclerotic heart disease of native coronary artery without angina pectoris: Secondary | ICD-10-CM | POA: Diagnosis not present

## 2016-10-13 DIAGNOSIS — Z951 Presence of aortocoronary bypass graft: Secondary | ICD-10-CM | POA: Diagnosis not present

## 2016-10-13 DIAGNOSIS — J449 Chronic obstructive pulmonary disease, unspecified: Secondary | ICD-10-CM | POA: Diagnosis not present

## 2016-10-13 DIAGNOSIS — Z79899 Other long term (current) drug therapy: Secondary | ICD-10-CM | POA: Diagnosis not present

## 2016-10-13 DIAGNOSIS — G5792 Unspecified mononeuropathy of left lower limb: Secondary | ICD-10-CM | POA: Diagnosis not present

## 2016-10-13 DIAGNOSIS — Z7982 Long term (current) use of aspirin: Secondary | ICD-10-CM | POA: Diagnosis not present

## 2016-10-13 DIAGNOSIS — Z9981 Dependence on supplemental oxygen: Secondary | ICD-10-CM | POA: Diagnosis not present

## 2016-10-13 DIAGNOSIS — I252 Old myocardial infarction: Secondary | ICD-10-CM | POA: Diagnosis not present

## 2016-10-13 DIAGNOSIS — I509 Heart failure, unspecified: Secondary | ICD-10-CM | POA: Diagnosis not present

## 2016-10-13 DIAGNOSIS — Z87891 Personal history of nicotine dependence: Secondary | ICD-10-CM | POA: Diagnosis not present

## 2016-10-13 DIAGNOSIS — I132 Hypertensive heart and chronic kidney disease with heart failure and with stage 5 chronic kidney disease, or end stage renal disease: Secondary | ICD-10-CM | POA: Diagnosis not present

## 2016-10-13 DIAGNOSIS — Z992 Dependence on renal dialysis: Secondary | ICD-10-CM | POA: Diagnosis not present

## 2016-10-13 DIAGNOSIS — Z95810 Presence of automatic (implantable) cardiac defibrillator: Secondary | ICD-10-CM | POA: Diagnosis not present

## 2016-10-13 DIAGNOSIS — N186 End stage renal disease: Secondary | ICD-10-CM | POA: Diagnosis not present

## 2016-10-14 DIAGNOSIS — D509 Iron deficiency anemia, unspecified: Secondary | ICD-10-CM | POA: Diagnosis not present

## 2016-10-14 DIAGNOSIS — N2581 Secondary hyperparathyroidism of renal origin: Secondary | ICD-10-CM | POA: Diagnosis not present

## 2016-10-14 DIAGNOSIS — N186 End stage renal disease: Secondary | ICD-10-CM | POA: Diagnosis not present

## 2016-10-16 DIAGNOSIS — N2581 Secondary hyperparathyroidism of renal origin: Secondary | ICD-10-CM | POA: Diagnosis not present

## 2016-10-16 DIAGNOSIS — D509 Iron deficiency anemia, unspecified: Secondary | ICD-10-CM | POA: Diagnosis not present

## 2016-10-16 DIAGNOSIS — I509 Heart failure, unspecified: Secondary | ICD-10-CM | POA: Diagnosis not present

## 2016-10-16 DIAGNOSIS — N186 End stage renal disease: Secondary | ICD-10-CM | POA: Diagnosis not present

## 2016-10-19 DIAGNOSIS — N2581 Secondary hyperparathyroidism of renal origin: Secondary | ICD-10-CM | POA: Diagnosis not present

## 2016-10-19 DIAGNOSIS — N186 End stage renal disease: Secondary | ICD-10-CM | POA: Diagnosis not present

## 2016-10-19 DIAGNOSIS — D509 Iron deficiency anemia, unspecified: Secondary | ICD-10-CM | POA: Diagnosis not present

## 2016-10-21 DIAGNOSIS — N2581 Secondary hyperparathyroidism of renal origin: Secondary | ICD-10-CM | POA: Diagnosis not present

## 2016-10-21 DIAGNOSIS — N186 End stage renal disease: Secondary | ICD-10-CM | POA: Diagnosis not present

## 2016-10-21 DIAGNOSIS — D509 Iron deficiency anemia, unspecified: Secondary | ICD-10-CM | POA: Diagnosis not present

## 2016-10-23 DIAGNOSIS — N186 End stage renal disease: Secondary | ICD-10-CM | POA: Diagnosis not present

## 2016-10-23 DIAGNOSIS — D509 Iron deficiency anemia, unspecified: Secondary | ICD-10-CM | POA: Diagnosis not present

## 2016-10-26 DIAGNOSIS — D509 Iron deficiency anemia, unspecified: Secondary | ICD-10-CM | POA: Diagnosis not present

## 2016-10-26 DIAGNOSIS — N186 End stage renal disease: Secondary | ICD-10-CM | POA: Diagnosis not present

## 2016-10-28 DIAGNOSIS — N186 End stage renal disease: Secondary | ICD-10-CM | POA: Diagnosis not present

## 2016-10-28 DIAGNOSIS — D509 Iron deficiency anemia, unspecified: Secondary | ICD-10-CM | POA: Diagnosis not present

## 2016-10-30 DIAGNOSIS — N186 End stage renal disease: Secondary | ICD-10-CM | POA: Diagnosis not present

## 2016-10-30 DIAGNOSIS — D509 Iron deficiency anemia, unspecified: Secondary | ICD-10-CM | POA: Diagnosis not present

## 2016-11-02 DIAGNOSIS — N186 End stage renal disease: Secondary | ICD-10-CM | POA: Diagnosis not present

## 2016-11-02 DIAGNOSIS — D509 Iron deficiency anemia, unspecified: Secondary | ICD-10-CM | POA: Diagnosis not present

## 2016-11-02 DIAGNOSIS — I509 Heart failure, unspecified: Secondary | ICD-10-CM | POA: Diagnosis not present

## 2016-11-02 DIAGNOSIS — Z23 Encounter for immunization: Secondary | ICD-10-CM | POA: Diagnosis not present

## 2016-11-04 DIAGNOSIS — Z23 Encounter for immunization: Secondary | ICD-10-CM | POA: Diagnosis not present

## 2016-11-04 DIAGNOSIS — N186 End stage renal disease: Secondary | ICD-10-CM | POA: Diagnosis not present

## 2016-11-04 DIAGNOSIS — D509 Iron deficiency anemia, unspecified: Secondary | ICD-10-CM | POA: Diagnosis not present

## 2016-11-06 DIAGNOSIS — Z23 Encounter for immunization: Secondary | ICD-10-CM | POA: Diagnosis not present

## 2016-11-06 DIAGNOSIS — D509 Iron deficiency anemia, unspecified: Secondary | ICD-10-CM | POA: Diagnosis not present

## 2016-11-06 DIAGNOSIS — N186 End stage renal disease: Secondary | ICD-10-CM | POA: Diagnosis not present

## 2016-11-09 DIAGNOSIS — Z23 Encounter for immunization: Secondary | ICD-10-CM | POA: Diagnosis not present

## 2016-11-09 DIAGNOSIS — D509 Iron deficiency anemia, unspecified: Secondary | ICD-10-CM | POA: Diagnosis not present

## 2016-11-09 DIAGNOSIS — N186 End stage renal disease: Secondary | ICD-10-CM | POA: Diagnosis not present

## 2016-11-11 DIAGNOSIS — Z992 Dependence on renal dialysis: Secondary | ICD-10-CM | POA: Diagnosis not present

## 2016-11-11 DIAGNOSIS — D509 Iron deficiency anemia, unspecified: Secondary | ICD-10-CM | POA: Diagnosis not present

## 2016-11-11 DIAGNOSIS — Z23 Encounter for immunization: Secondary | ICD-10-CM | POA: Diagnosis not present

## 2016-11-11 DIAGNOSIS — N186 End stage renal disease: Secondary | ICD-10-CM | POA: Diagnosis not present

## 2016-11-13 DIAGNOSIS — N186 End stage renal disease: Secondary | ICD-10-CM | POA: Diagnosis not present

## 2016-11-13 DIAGNOSIS — D509 Iron deficiency anemia, unspecified: Secondary | ICD-10-CM | POA: Diagnosis not present

## 2016-11-16 DIAGNOSIS — D509 Iron deficiency anemia, unspecified: Secondary | ICD-10-CM | POA: Diagnosis not present

## 2016-11-16 DIAGNOSIS — N186 End stage renal disease: Secondary | ICD-10-CM | POA: Diagnosis not present

## 2016-11-16 DIAGNOSIS — I509 Heart failure, unspecified: Secondary | ICD-10-CM | POA: Diagnosis not present

## 2016-11-17 DIAGNOSIS — R05 Cough: Secondary | ICD-10-CM | POA: Diagnosis not present

## 2016-11-18 DIAGNOSIS — N186 End stage renal disease: Secondary | ICD-10-CM | POA: Diagnosis not present

## 2016-11-18 DIAGNOSIS — D509 Iron deficiency anemia, unspecified: Secondary | ICD-10-CM | POA: Diagnosis not present

## 2016-11-20 DIAGNOSIS — N186 End stage renal disease: Secondary | ICD-10-CM | POA: Diagnosis not present

## 2016-11-20 DIAGNOSIS — D509 Iron deficiency anemia, unspecified: Secondary | ICD-10-CM | POA: Diagnosis not present

## 2016-11-23 DIAGNOSIS — D631 Anemia in chronic kidney disease: Secondary | ICD-10-CM | POA: Diagnosis not present

## 2016-11-23 DIAGNOSIS — N186 End stage renal disease: Secondary | ICD-10-CM | POA: Diagnosis not present

## 2016-11-23 DIAGNOSIS — D509 Iron deficiency anemia, unspecified: Secondary | ICD-10-CM | POA: Diagnosis not present

## 2016-11-25 DIAGNOSIS — N186 End stage renal disease: Secondary | ICD-10-CM | POA: Diagnosis not present

## 2016-11-25 DIAGNOSIS — D631 Anemia in chronic kidney disease: Secondary | ICD-10-CM | POA: Diagnosis not present

## 2016-11-25 DIAGNOSIS — D509 Iron deficiency anemia, unspecified: Secondary | ICD-10-CM | POA: Diagnosis not present

## 2016-11-27 DIAGNOSIS — D631 Anemia in chronic kidney disease: Secondary | ICD-10-CM | POA: Diagnosis not present

## 2016-11-27 DIAGNOSIS — D509 Iron deficiency anemia, unspecified: Secondary | ICD-10-CM | POA: Diagnosis not present

## 2016-11-27 DIAGNOSIS — N186 End stage renal disease: Secondary | ICD-10-CM | POA: Diagnosis not present

## 2016-11-29 DIAGNOSIS — D509 Iron deficiency anemia, unspecified: Secondary | ICD-10-CM | POA: Diagnosis not present

## 2016-11-29 DIAGNOSIS — N186 End stage renal disease: Secondary | ICD-10-CM | POA: Diagnosis not present

## 2016-11-29 DIAGNOSIS — D631 Anemia in chronic kidney disease: Secondary | ICD-10-CM | POA: Diagnosis not present

## 2016-12-01 DIAGNOSIS — N186 End stage renal disease: Secondary | ICD-10-CM | POA: Diagnosis not present

## 2016-12-01 DIAGNOSIS — D509 Iron deficiency anemia, unspecified: Secondary | ICD-10-CM | POA: Diagnosis not present

## 2016-12-03 DIAGNOSIS — I509 Heart failure, unspecified: Secondary | ICD-10-CM | POA: Diagnosis not present

## 2016-12-04 DIAGNOSIS — N186 End stage renal disease: Secondary | ICD-10-CM | POA: Diagnosis not present

## 2016-12-04 DIAGNOSIS — D509 Iron deficiency anemia, unspecified: Secondary | ICD-10-CM | POA: Diagnosis not present

## 2016-12-07 DIAGNOSIS — D509 Iron deficiency anemia, unspecified: Secondary | ICD-10-CM | POA: Diagnosis not present

## 2016-12-07 DIAGNOSIS — N186 End stage renal disease: Secondary | ICD-10-CM | POA: Diagnosis not present

## 2016-12-09 DIAGNOSIS — D509 Iron deficiency anemia, unspecified: Secondary | ICD-10-CM | POA: Diagnosis not present

## 2016-12-09 DIAGNOSIS — N186 End stage renal disease: Secondary | ICD-10-CM | POA: Diagnosis not present

## 2016-12-11 DIAGNOSIS — N186 End stage renal disease: Secondary | ICD-10-CM | POA: Diagnosis not present

## 2016-12-11 DIAGNOSIS — Z992 Dependence on renal dialysis: Secondary | ICD-10-CM | POA: Diagnosis not present

## 2016-12-11 DIAGNOSIS — D509 Iron deficiency anemia, unspecified: Secondary | ICD-10-CM | POA: Diagnosis not present

## 2016-12-14 DIAGNOSIS — D509 Iron deficiency anemia, unspecified: Secondary | ICD-10-CM | POA: Diagnosis not present

## 2016-12-14 DIAGNOSIS — N186 End stage renal disease: Secondary | ICD-10-CM | POA: Diagnosis not present

## 2016-12-16 DIAGNOSIS — I509 Heart failure, unspecified: Secondary | ICD-10-CM | POA: Diagnosis not present

## 2016-12-16 DIAGNOSIS — N186 End stage renal disease: Secondary | ICD-10-CM | POA: Diagnosis not present

## 2016-12-16 DIAGNOSIS — D509 Iron deficiency anemia, unspecified: Secondary | ICD-10-CM | POA: Diagnosis not present

## 2016-12-18 DIAGNOSIS — N186 End stage renal disease: Secondary | ICD-10-CM | POA: Diagnosis not present

## 2016-12-18 DIAGNOSIS — D509 Iron deficiency anemia, unspecified: Secondary | ICD-10-CM | POA: Diagnosis not present

## 2016-12-23 DIAGNOSIS — N186 End stage renal disease: Secondary | ICD-10-CM | POA: Diagnosis not present

## 2016-12-25 DIAGNOSIS — N186 End stage renal disease: Secondary | ICD-10-CM | POA: Diagnosis not present

## 2016-12-28 DIAGNOSIS — N186 End stage renal disease: Secondary | ICD-10-CM | POA: Diagnosis not present

## 2016-12-29 DIAGNOSIS — R05 Cough: Secondary | ICD-10-CM | POA: Diagnosis not present

## 2016-12-29 DIAGNOSIS — R0602 Shortness of breath: Secondary | ICD-10-CM | POA: Diagnosis not present

## 2016-12-29 DIAGNOSIS — J441 Chronic obstructive pulmonary disease with (acute) exacerbation: Secondary | ICD-10-CM | POA: Diagnosis not present

## 2016-12-29 DIAGNOSIS — Z6824 Body mass index (BMI) 24.0-24.9, adult: Secondary | ICD-10-CM | POA: Diagnosis not present

## 2016-12-30 DIAGNOSIS — N186 End stage renal disease: Secondary | ICD-10-CM | POA: Diagnosis not present

## 2017-01-01 DIAGNOSIS — N186 End stage renal disease: Secondary | ICD-10-CM | POA: Diagnosis not present

## 2017-01-02 DIAGNOSIS — I509 Heart failure, unspecified: Secondary | ICD-10-CM | POA: Diagnosis not present

## 2017-01-03 DIAGNOSIS — N186 End stage renal disease: Secondary | ICD-10-CM | POA: Diagnosis not present

## 2017-01-06 DIAGNOSIS — N186 End stage renal disease: Secondary | ICD-10-CM | POA: Diagnosis not present

## 2017-01-07 DIAGNOSIS — J189 Pneumonia, unspecified organism: Secondary | ICD-10-CM | POA: Diagnosis not present

## 2017-01-07 DIAGNOSIS — R05 Cough: Secondary | ICD-10-CM | POA: Diagnosis not present

## 2017-01-07 DIAGNOSIS — Z6824 Body mass index (BMI) 24.0-24.9, adult: Secondary | ICD-10-CM | POA: Diagnosis not present

## 2017-01-07 DIAGNOSIS — I2510 Atherosclerotic heart disease of native coronary artery without angina pectoris: Secondary | ICD-10-CM | POA: Diagnosis not present

## 2017-01-07 DIAGNOSIS — I255 Ischemic cardiomyopathy: Secondary | ICD-10-CM | POA: Diagnosis not present

## 2017-01-07 DIAGNOSIS — J181 Lobar pneumonia, unspecified organism: Secondary | ICD-10-CM | POA: Diagnosis not present

## 2017-01-07 DIAGNOSIS — N186 End stage renal disease: Secondary | ICD-10-CM | POA: Diagnosis not present

## 2017-01-07 DIAGNOSIS — I739 Peripheral vascular disease, unspecified: Secondary | ICD-10-CM | POA: Diagnosis not present

## 2017-01-07 DIAGNOSIS — I5022 Chronic systolic (congestive) heart failure: Secondary | ICD-10-CM | POA: Diagnosis not present

## 2017-01-08 DIAGNOSIS — N186 End stage renal disease: Secondary | ICD-10-CM | POA: Diagnosis not present

## 2017-01-10 DIAGNOSIS — N186 End stage renal disease: Secondary | ICD-10-CM | POA: Diagnosis not present

## 2017-01-11 DIAGNOSIS — Z992 Dependence on renal dialysis: Secondary | ICD-10-CM | POA: Diagnosis not present

## 2017-01-11 DIAGNOSIS — N186 End stage renal disease: Secondary | ICD-10-CM | POA: Diagnosis not present

## 2017-01-13 DIAGNOSIS — Z1159 Encounter for screening for other viral diseases: Secondary | ICD-10-CM | POA: Diagnosis not present

## 2017-01-13 DIAGNOSIS — D509 Iron deficiency anemia, unspecified: Secondary | ICD-10-CM | POA: Diagnosis not present

## 2017-01-13 DIAGNOSIS — N2581 Secondary hyperparathyroidism of renal origin: Secondary | ICD-10-CM | POA: Diagnosis not present

## 2017-01-13 DIAGNOSIS — N186 End stage renal disease: Secondary | ICD-10-CM | POA: Diagnosis not present

## 2017-01-15 DIAGNOSIS — N186 End stage renal disease: Secondary | ICD-10-CM | POA: Diagnosis not present

## 2017-01-15 DIAGNOSIS — D509 Iron deficiency anemia, unspecified: Secondary | ICD-10-CM | POA: Diagnosis not present

## 2017-01-15 DIAGNOSIS — N2581 Secondary hyperparathyroidism of renal origin: Secondary | ICD-10-CM | POA: Diagnosis not present

## 2017-01-16 DIAGNOSIS — I509 Heart failure, unspecified: Secondary | ICD-10-CM | POA: Diagnosis not present

## 2017-01-18 DIAGNOSIS — N2581 Secondary hyperparathyroidism of renal origin: Secondary | ICD-10-CM | POA: Diagnosis not present

## 2017-01-18 DIAGNOSIS — D509 Iron deficiency anemia, unspecified: Secondary | ICD-10-CM | POA: Diagnosis not present

## 2017-01-18 DIAGNOSIS — N186 End stage renal disease: Secondary | ICD-10-CM | POA: Diagnosis not present

## 2017-01-20 DIAGNOSIS — N186 End stage renal disease: Secondary | ICD-10-CM | POA: Diagnosis not present

## 2017-01-20 DIAGNOSIS — N2581 Secondary hyperparathyroidism of renal origin: Secondary | ICD-10-CM | POA: Diagnosis not present

## 2017-01-20 DIAGNOSIS — D509 Iron deficiency anemia, unspecified: Secondary | ICD-10-CM | POA: Diagnosis not present

## 2017-01-20 DIAGNOSIS — I739 Peripheral vascular disease, unspecified: Secondary | ICD-10-CM | POA: Diagnosis not present

## 2017-01-21 DIAGNOSIS — Z6824 Body mass index (BMI) 24.0-24.9, adult: Secondary | ICD-10-CM | POA: Diagnosis not present

## 2017-01-21 DIAGNOSIS — J181 Lobar pneumonia, unspecified organism: Secondary | ICD-10-CM | POA: Diagnosis not present

## 2017-01-22 DIAGNOSIS — D509 Iron deficiency anemia, unspecified: Secondary | ICD-10-CM | POA: Diagnosis not present

## 2017-01-22 DIAGNOSIS — N186 End stage renal disease: Secondary | ICD-10-CM | POA: Diagnosis not present

## 2017-01-25 DIAGNOSIS — N186 End stage renal disease: Secondary | ICD-10-CM | POA: Diagnosis not present

## 2017-01-25 DIAGNOSIS — D509 Iron deficiency anemia, unspecified: Secondary | ICD-10-CM | POA: Diagnosis not present

## 2017-01-26 DIAGNOSIS — I509 Heart failure, unspecified: Secondary | ICD-10-CM | POA: Diagnosis not present

## 2017-01-26 DIAGNOSIS — N186 End stage renal disease: Secondary | ICD-10-CM | POA: Diagnosis not present

## 2017-01-26 DIAGNOSIS — I132 Hypertensive heart and chronic kidney disease with heart failure and with stage 5 chronic kidney disease, or end stage renal disease: Secondary | ICD-10-CM | POA: Diagnosis not present

## 2017-01-26 DIAGNOSIS — Z01818 Encounter for other preprocedural examination: Secondary | ICD-10-CM | POA: Diagnosis not present

## 2017-01-26 DIAGNOSIS — Z992 Dependence on renal dialysis: Secondary | ICD-10-CM | POA: Diagnosis not present

## 2017-01-27 DIAGNOSIS — D509 Iron deficiency anemia, unspecified: Secondary | ICD-10-CM | POA: Diagnosis not present

## 2017-01-27 DIAGNOSIS — I517 Cardiomegaly: Secondary | ICD-10-CM | POA: Diagnosis not present

## 2017-01-27 DIAGNOSIS — N186 End stage renal disease: Secondary | ICD-10-CM | POA: Diagnosis not present

## 2017-01-28 DIAGNOSIS — K219 Gastro-esophageal reflux disease without esophagitis: Secondary | ICD-10-CM | POA: Diagnosis not present

## 2017-01-28 DIAGNOSIS — I252 Old myocardial infarction: Secondary | ICD-10-CM | POA: Diagnosis not present

## 2017-01-28 DIAGNOSIS — N186 End stage renal disease: Secondary | ICD-10-CM | POA: Diagnosis not present

## 2017-01-28 DIAGNOSIS — T82868A Thrombosis of vascular prosthetic devices, implants and grafts, initial encounter: Secondary | ICD-10-CM | POA: Diagnosis not present

## 2017-01-28 DIAGNOSIS — Z951 Presence of aortocoronary bypass graft: Secondary | ICD-10-CM | POA: Diagnosis not present

## 2017-01-28 DIAGNOSIS — Z9981 Dependence on supplemental oxygen: Secondary | ICD-10-CM | POA: Diagnosis not present

## 2017-01-28 DIAGNOSIS — Z79899 Other long term (current) drug therapy: Secondary | ICD-10-CM | POA: Diagnosis not present

## 2017-01-28 DIAGNOSIS — Z7951 Long term (current) use of inhaled steroids: Secondary | ICD-10-CM | POA: Diagnosis not present

## 2017-01-28 DIAGNOSIS — I739 Peripheral vascular disease, unspecified: Secondary | ICD-10-CM | POA: Diagnosis not present

## 2017-01-28 DIAGNOSIS — Z95810 Presence of automatic (implantable) cardiac defibrillator: Secondary | ICD-10-CM | POA: Diagnosis not present

## 2017-01-28 DIAGNOSIS — J449 Chronic obstructive pulmonary disease, unspecified: Secondary | ICD-10-CM | POA: Diagnosis not present

## 2017-01-28 DIAGNOSIS — I132 Hypertensive heart and chronic kidney disease with heart failure and with stage 5 chronic kidney disease, or end stage renal disease: Secondary | ICD-10-CM | POA: Diagnosis not present

## 2017-01-28 DIAGNOSIS — Z7982 Long term (current) use of aspirin: Secondary | ICD-10-CM | POA: Diagnosis not present

## 2017-01-28 DIAGNOSIS — Z992 Dependence on renal dialysis: Secondary | ICD-10-CM | POA: Diagnosis not present

## 2017-01-28 DIAGNOSIS — I2510 Atherosclerotic heart disease of native coronary artery without angina pectoris: Secondary | ICD-10-CM | POA: Diagnosis not present

## 2017-01-28 DIAGNOSIS — I509 Heart failure, unspecified: Secondary | ICD-10-CM | POA: Diagnosis not present

## 2017-01-29 DIAGNOSIS — N186 End stage renal disease: Secondary | ICD-10-CM | POA: Diagnosis not present

## 2017-01-29 DIAGNOSIS — D509 Iron deficiency anemia, unspecified: Secondary | ICD-10-CM | POA: Diagnosis not present

## 2017-02-01 DIAGNOSIS — N186 End stage renal disease: Secondary | ICD-10-CM | POA: Diagnosis not present

## 2017-02-01 DIAGNOSIS — D509 Iron deficiency anemia, unspecified: Secondary | ICD-10-CM | POA: Diagnosis not present

## 2017-02-02 DIAGNOSIS — I509 Heart failure, unspecified: Secondary | ICD-10-CM | POA: Diagnosis not present

## 2017-02-03 DIAGNOSIS — N186 End stage renal disease: Secondary | ICD-10-CM | POA: Diagnosis not present

## 2017-02-03 DIAGNOSIS — D509 Iron deficiency anemia, unspecified: Secondary | ICD-10-CM | POA: Diagnosis not present

## 2017-02-05 DIAGNOSIS — D509 Iron deficiency anemia, unspecified: Secondary | ICD-10-CM | POA: Diagnosis not present

## 2017-02-05 DIAGNOSIS — N186 End stage renal disease: Secondary | ICD-10-CM | POA: Diagnosis not present

## 2017-02-08 DIAGNOSIS — N186 End stage renal disease: Secondary | ICD-10-CM | POA: Diagnosis not present

## 2017-02-08 DIAGNOSIS — D509 Iron deficiency anemia, unspecified: Secondary | ICD-10-CM | POA: Diagnosis not present

## 2017-02-10 DIAGNOSIS — D509 Iron deficiency anemia, unspecified: Secondary | ICD-10-CM | POA: Diagnosis not present

## 2017-02-10 DIAGNOSIS — N186 End stage renal disease: Secondary | ICD-10-CM | POA: Diagnosis not present

## 2017-02-11 DIAGNOSIS — Z992 Dependence on renal dialysis: Secondary | ICD-10-CM | POA: Diagnosis not present

## 2017-02-11 DIAGNOSIS — N186 End stage renal disease: Secondary | ICD-10-CM | POA: Diagnosis not present

## 2017-02-12 DIAGNOSIS — D509 Iron deficiency anemia, unspecified: Secondary | ICD-10-CM | POA: Diagnosis not present

## 2017-02-12 DIAGNOSIS — N186 End stage renal disease: Secondary | ICD-10-CM | POA: Diagnosis not present

## 2017-02-15 DIAGNOSIS — D509 Iron deficiency anemia, unspecified: Secondary | ICD-10-CM | POA: Diagnosis not present

## 2017-02-15 DIAGNOSIS — N186 End stage renal disease: Secondary | ICD-10-CM | POA: Diagnosis not present

## 2017-02-16 DIAGNOSIS — I509 Heart failure, unspecified: Secondary | ICD-10-CM | POA: Diagnosis not present

## 2017-02-17 DIAGNOSIS — N186 End stage renal disease: Secondary | ICD-10-CM | POA: Diagnosis not present

## 2017-02-17 DIAGNOSIS — D509 Iron deficiency anemia, unspecified: Secondary | ICD-10-CM | POA: Diagnosis not present

## 2017-02-19 DIAGNOSIS — D509 Iron deficiency anemia, unspecified: Secondary | ICD-10-CM | POA: Diagnosis not present

## 2017-02-19 DIAGNOSIS — N186 End stage renal disease: Secondary | ICD-10-CM | POA: Diagnosis not present

## 2017-02-23 DIAGNOSIS — D509 Iron deficiency anemia, unspecified: Secondary | ICD-10-CM | POA: Diagnosis not present

## 2017-02-23 DIAGNOSIS — N186 End stage renal disease: Secondary | ICD-10-CM | POA: Diagnosis not present

## 2017-02-24 DIAGNOSIS — Z789 Other specified health status: Secondary | ICD-10-CM | POA: Diagnosis not present

## 2017-02-24 DIAGNOSIS — Z992 Dependence on renal dialysis: Secondary | ICD-10-CM | POA: Diagnosis not present

## 2017-02-24 DIAGNOSIS — N186 End stage renal disease: Secondary | ICD-10-CM | POA: Diagnosis not present

## 2017-02-24 DIAGNOSIS — D509 Iron deficiency anemia, unspecified: Secondary | ICD-10-CM | POA: Diagnosis not present

## 2017-02-24 DIAGNOSIS — N184 Chronic kidney disease, stage 4 (severe): Secondary | ICD-10-CM | POA: Diagnosis not present

## 2017-02-24 DIAGNOSIS — J449 Chronic obstructive pulmonary disease, unspecified: Secondary | ICD-10-CM | POA: Diagnosis not present

## 2017-02-26 DIAGNOSIS — N186 End stage renal disease: Secondary | ICD-10-CM | POA: Diagnosis not present

## 2017-02-26 DIAGNOSIS — D509 Iron deficiency anemia, unspecified: Secondary | ICD-10-CM | POA: Diagnosis not present

## 2017-03-01 DIAGNOSIS — N186 End stage renal disease: Secondary | ICD-10-CM | POA: Diagnosis not present

## 2017-03-01 DIAGNOSIS — D509 Iron deficiency anemia, unspecified: Secondary | ICD-10-CM | POA: Diagnosis not present

## 2017-03-02 DIAGNOSIS — R05 Cough: Secondary | ICD-10-CM | POA: Diagnosis not present

## 2017-03-02 DIAGNOSIS — Z6824 Body mass index (BMI) 24.0-24.9, adult: Secondary | ICD-10-CM | POA: Diagnosis not present

## 2017-03-02 DIAGNOSIS — J441 Chronic obstructive pulmonary disease with (acute) exacerbation: Secondary | ICD-10-CM | POA: Diagnosis not present

## 2017-03-02 DIAGNOSIS — J029 Acute pharyngitis, unspecified: Secondary | ICD-10-CM | POA: Diagnosis not present

## 2017-03-03 DIAGNOSIS — N186 End stage renal disease: Secondary | ICD-10-CM | POA: Diagnosis not present

## 2017-03-03 DIAGNOSIS — D509 Iron deficiency anemia, unspecified: Secondary | ICD-10-CM | POA: Diagnosis not present

## 2017-03-12 DEATH — deceased
